# Patient Record
Sex: Male | Born: 1955 | Race: White | Hispanic: No | Marital: Married | Smoking: Former smoker
Health system: Southern US, Community
[De-identification: ages and names within clinical notes are randomized; demographics above are authoritative.]

## PROBLEM LIST (undated history)

## (undated) DIAGNOSIS — N200 Calculus of kidney: Secondary | ICD-10-CM

## (undated) DIAGNOSIS — R011 Cardiac murmur, unspecified: Secondary | ICD-10-CM

## (undated) DIAGNOSIS — E119 Type 2 diabetes mellitus without complications: Secondary | ICD-10-CM

## (undated) DIAGNOSIS — E7800 Pure hypercholesterolemia, unspecified: Secondary | ICD-10-CM

## (undated) DIAGNOSIS — M109 Gout, unspecified: Secondary | ICD-10-CM

## (undated) DIAGNOSIS — I2510 Atherosclerotic heart disease of native coronary artery without angina pectoris: Secondary | ICD-10-CM

## (undated) DIAGNOSIS — K219 Gastro-esophageal reflux disease without esophagitis: Secondary | ICD-10-CM

## (undated) DIAGNOSIS — I10 Essential (primary) hypertension: Secondary | ICD-10-CM

## (undated) DIAGNOSIS — K602 Anal fissure, unspecified: Secondary | ICD-10-CM

## (undated) DIAGNOSIS — R432 Parageusia: Secondary | ICD-10-CM

## (undated) DIAGNOSIS — K635 Polyp of colon: Secondary | ICD-10-CM

## (undated) HISTORY — DX: Anal fissure, unspecified: K60.2

## (undated) HISTORY — DX: Calculus of kidney: N20.0

## (undated) HISTORY — DX: Atherosclerotic heart disease of native coronary artery without angina pectoris: I25.10

## (undated) HISTORY — PX: NO PAST SURGERIES: SHX2092

## (undated) HISTORY — DX: Polyp of colon: K63.5

---

## 2002-12-03 ENCOUNTER — Other Ambulatory Visit: Admission: RE | Admit: 2002-12-03 | Discharge: 2002-12-03 | Payer: Self-pay | Admitting: Otolaryngology

## 2002-12-04 ENCOUNTER — Encounter: Payer: Self-pay | Admitting: Otolaryngology

## 2002-12-04 ENCOUNTER — Encounter: Admission: RE | Admit: 2002-12-04 | Discharge: 2002-12-04 | Payer: Self-pay | Admitting: Otolaryngology

## 2003-05-25 ENCOUNTER — Emergency Department (HOSPITAL_COMMUNITY): Admission: EM | Admit: 2003-05-25 | Discharge: 2003-05-26 | Payer: Self-pay | Admitting: Emergency Medicine

## 2012-01-24 ENCOUNTER — Observation Stay (HOSPITAL_COMMUNITY)
Admission: EM | Admit: 2012-01-24 | Discharge: 2012-01-25 | Disposition: A | Discharge status: Home / Self Care | Payer: BC Managed Care – PPO | Source: Ambulatory Visit | Attending: Internal Medicine | Admitting: Internal Medicine

## 2012-01-24 ENCOUNTER — Other Ambulatory Visit: Payer: Self-pay

## 2012-01-24 ENCOUNTER — Emergency Department (HOSPITAL_COMMUNITY): Payer: BC Managed Care – PPO

## 2012-01-24 ENCOUNTER — Encounter (HOSPITAL_COMMUNITY): Payer: Self-pay | Admitting: Emergency Medicine

## 2012-01-24 DIAGNOSIS — R011 Cardiac murmur, unspecified: Secondary | ICD-10-CM | POA: Insufficient documentation

## 2012-01-24 DIAGNOSIS — K219 Gastro-esophageal reflux disease without esophagitis: Secondary | ICD-10-CM | POA: Diagnosis present

## 2012-01-24 DIAGNOSIS — R432 Parageusia: Secondary | ICD-10-CM

## 2012-01-24 DIAGNOSIS — D170 Benign lipomatous neoplasm of skin and subcutaneous tissue of head, face and neck: Secondary | ICD-10-CM | POA: Diagnosis present

## 2012-01-24 DIAGNOSIS — E119 Type 2 diabetes mellitus without complications: Secondary | ICD-10-CM | POA: Insufficient documentation

## 2012-01-24 DIAGNOSIS — I10 Essential (primary) hypertension: Secondary | ICD-10-CM | POA: Diagnosis present

## 2012-01-24 DIAGNOSIS — D1779 Benign lipomatous neoplasm of other sites: Secondary | ICD-10-CM | POA: Insufficient documentation

## 2012-01-24 DIAGNOSIS — R079 Chest pain, unspecified: Principal | ICD-10-CM | POA: Diagnosis present

## 2012-01-24 DIAGNOSIS — J329 Chronic sinusitis, unspecified: Secondary | ICD-10-CM | POA: Insufficient documentation

## 2012-01-24 DIAGNOSIS — R0789 Other chest pain: Secondary | ICD-10-CM

## 2012-01-24 DIAGNOSIS — E785 Hyperlipidemia, unspecified: Secondary | ICD-10-CM | POA: Diagnosis present

## 2012-01-24 HISTORY — DX: Gout, unspecified: M10.9

## 2012-01-24 HISTORY — DX: Type 2 diabetes mellitus without complications: E11.9

## 2012-01-24 HISTORY — DX: Parageusia: R43.2

## 2012-01-24 HISTORY — DX: Essential (primary) hypertension: I10

## 2012-01-24 HISTORY — DX: Cardiac murmur, unspecified: R01.1

## 2012-01-24 HISTORY — DX: Gastro-esophageal reflux disease without esophagitis: K21.9

## 2012-01-24 HISTORY — DX: Pure hypercholesterolemia, unspecified: E78.00

## 2012-01-24 LAB — DIFFERENTIAL
Eosinophils Relative: 3 % (ref 0–5)
Lymphocytes Relative: 26 % (ref 12–46)
Lymphs Abs: 2.3 10*3/uL (ref 0.7–4.0)
Monocytes Absolute: 0.6 10*3/uL (ref 0.1–1.0)
Monocytes Relative: 7 % (ref 3–12)
Neutro Abs: 5.9 10*3/uL (ref 1.7–7.7)

## 2012-01-24 LAB — COMPREHENSIVE METABOLIC PANEL
Albumin: 4.5 g/dL (ref 3.5–5.2)
Alkaline Phosphatase: 42 U/L (ref 39–117)
BUN: 14 mg/dL (ref 6–23)
CO2: 21 mEq/L (ref 19–32)
Chloride: 99 mEq/L (ref 96–112)
Creatinine, Ser: 0.57 mg/dL (ref 0.50–1.35)
GFR calc non Af Amer: >90 mL/min (ref >90)
Glucose, Bld: 107 mg/dL — ABNORMAL HIGH (ref 70–99)
Potassium: 3.9 mEq/L (ref 3.5–5.1)
Total Bilirubin: 0.3 mg/dL (ref 0.3–1.2)

## 2012-01-24 LAB — BASIC METABOLIC PANEL
BUN: 16 mg/dL (ref 6–23)
CO2: 19 mEq/L (ref 19–32)
Calcium: 10 mg/dL (ref 8.4–10.5)
Chloride: 100 mEq/L (ref 96–112)
Creatinine, Ser: 0.6 mg/dL (ref 0.50–1.35)
GFR calc Af Amer: >90 mL/min (ref >90)
GFR calc non Af Amer: >90 mL/min (ref >90)
Glucose, Bld: 197 mg/dL — ABNORMAL HIGH (ref 70–99)
Potassium: 4.1 mEq/L (ref 3.5–5.1)
Sodium: 136 mEq/L (ref 135–145)

## 2012-01-24 LAB — CBC
HCT: 44 % (ref 39.0–52.0)
HCT: 44.7 % (ref 39.0–52.0)
Hemoglobin: 15.8 g/dL (ref 13.0–17.0)
Hemoglobin: 16 g/dL (ref 13.0–17.0)
MCH: 31.2 pg (ref 26.0–34.0)
MCHC: 35.9 g/dL (ref 30.0–36.0)
MCV: 87 fL (ref 78.0–100.0)
MCV: 86.3 fL (ref 78.0–100.0)
Platelets: 208 10*3/uL (ref 150–400)
RBC: 5.06 MIL/uL (ref 4.22–5.81)
RBC: 5.18 MIL/uL (ref 4.22–5.81)
RDW: 12.7 % (ref 11.5–15.5)
RDW: 12.6 % (ref 11.5–15.5)
WBC: 7 10*3/uL (ref 4.0–10.5)
WBC: 9.1 10*3/uL (ref 4.0–10.5)

## 2012-01-24 LAB — CARDIAC PANEL(CRET KIN+CKTOT+MB+TROPI)
CK, MB: 4.1 ng/mL — ABNORMAL HIGH (ref 0.3–4.0)
Relative Index: 2.5 (ref 0.0–2.5)

## 2012-01-24 LAB — TROPONIN I: Troponin I: <0.3 ng/mL (ref <0.30)

## 2012-01-24 LAB — D-DIMER, QUANTITATIVE: D-Dimer, Quant: <0.22 ug/mL-FEU (ref 0.00–0.48)

## 2012-01-24 LAB — MAGNESIUM: Magnesium: 1.9 mg/dL (ref 1.5–2.5)

## 2012-01-24 LAB — HEMOGLOBIN A1C: Hgb A1c MFr Bld: 7.6 % — ABNORMAL HIGH (ref <5.7)

## 2012-01-24 MED ORDER — SODIUM CHLORIDE 0.9 % IV SOLN: 250.0000 mL | INTRAVENOUS | Status: DC | PRN

## 2012-01-24 MED ORDER — SODIUM CHLORIDE 0.9 % IJ SOLN
3.0000 mL | Freq: Two times a day (BID) | INTRAMUSCULAR | Status: DC
  Administered 2012-01-24: 3 mL via INTRAVENOUS

## 2012-01-24 MED ORDER — SIMVASTATIN 20 MG PO TABS
20.0000 mg | ORAL_TABLET | Freq: Every day | ORAL | Status: DC
  Filled 2012-01-24: qty 1

## 2012-01-24 MED ORDER — ASPIRIN 81 MG PO CHEW: 324.0000 mg | CHEWABLE_TABLET | ORAL | Status: DC

## 2012-01-24 MED ORDER — PANTOPRAZOLE SODIUM 40 MG PO TBEC
40.0000 mg | DELAYED_RELEASE_TABLET | Freq: Every day | ORAL | Status: DC
  Administered 2012-01-24: 40 mg via ORAL
  Filled 2012-01-24: qty 1

## 2012-01-24 MED ORDER — ASPIRIN EC 81 MG PO TBEC
81.0000 mg | DELAYED_RELEASE_TABLET | Freq: Every day | ORAL | Status: DC
  Administered 2012-01-25: 81 mg via ORAL
  Filled 2012-01-24: qty 1

## 2012-01-24 MED ORDER — ACETAMINOPHEN 325 MG PO TABS: 650.0000 mg | ORAL_TABLET | ORAL | Status: DC | PRN

## 2012-01-24 MED ORDER — LISINOPRIL 20 MG PO TABS
20.0000 mg | ORAL_TABLET | Freq: Every day | ORAL | Status: DC
  Administered 2012-01-25: 20 mg via ORAL
  Filled 2012-01-24: qty 1

## 2012-01-24 MED ORDER — ASPIRIN 300 MG RE SUPP
300.0000 mg | RECTAL | Status: DC
  Filled 2012-01-24: qty 1

## 2012-01-24 MED ORDER — NITROGLYCERIN 0.4 MG SL SUBL
0.4000 mg | SUBLINGUAL_TABLET | SUBLINGUAL | Status: DC | PRN
Start: 2012-01-24 — End: 2012-01-25

## 2012-01-24 MED ORDER — ENOXAPARIN SODIUM 40 MG/0.4ML ~~LOC~~ SOLN
40.0000 mg | SUBCUTANEOUS | Status: DC
  Administered 2012-01-24: 40 mg via SUBCUTANEOUS
  Filled 2012-01-24 (×2): qty 0.4

## 2012-01-24 MED ORDER — ALPRAZOLAM 0.25 MG PO TABS: 0.2500 mg | ORAL_TABLET | Freq: Two times a day (BID) | ORAL | Status: DC | PRN

## 2012-01-24 MED ORDER — SODIUM CHLORIDE 0.9 % IJ SOLN: 3.0000 mL | INTRAMUSCULAR | Status: DC | PRN

## 2012-01-24 MED ORDER — ONDANSETRON HCL 4 MG/2ML IJ SOLN: 4.0000 mg | Freq: Four times a day (QID) | INTRAMUSCULAR | Status: DC | PRN

## 2012-01-24 NOTE — ED Notes (Signed)
Admitting MD at bedside.

## 2012-01-24 NOTE — H&P (Signed)
[facility] History and Physical  Tramane Gorum ZOX:[id number] DOB: Jun 01, 1956 DOA: 01/24/2012  Referring physician: EDP PCP: Ailene Ravel, MD   Chief Complaint: Chest Pain  HPI:  56 yo gentleman with hyperlipidemia, hypertension and congenital PFO who has a PCP in [place], [place] that he sees for primary care. He developed persistent intermittent chest pain this AM - pain not induced by activity, described as "palpitations" has sense of anxiety and that "something just isn't right in my chest"- very difficult for him to describe. No diaphoresis, or SOB. No radiation of pain, all substernal. No recent illness or cough. No new medications - he does say that he has had an unintentional 20 lbs weight loss in the past 6 months. Father has hx of heart attack. Does not smoke, but uses smokeless tobacco  Review of Systems:  Negative except for HPI.  Past Medical History  Diagnosis Date  . Hypertension   . Hypercholesteremia   . Diabetes mellitus    History reviewed. No pertinent past surgical history. Social History:  does not have a smoking history on file. His smokeless tobacco use includes Snuff. He reports that he drinks alcohol. He reports that he does not use illicit drugs.  Allergies  Allergen Reactions  . Sulfa Antibiotics Rash    No family history on file.  Prior to Admission medications   Medication Sig Start Date End Date Taking? Authorizing Provider  aspirin EC 81 MG tablet Take 81 mg by mouth daily.   Yes Historical Provider, MD  lisinopril (PRINIVIL,ZESTRIL) 20 MG tablet Take 20 mg by mouth daily.   Yes Historical Provider, MD  pravastatin (PRAVACHOL) 40 MG tablet Take 40 mg by mouth daily.   Yes Historical Provider, MD  ranitidine (ZANTAC) 150 MG capsule Take 150 mg by mouth daily.   Yes Historical Provider, MD   Physical Exam: Filed Vitals:   01/24/12 1747 01/24/12 1800 01/24/12 1830 01/24/12 1845  BP: 116/99 126/75 126/75 127/74  Pulse: 65 65 68 66  Temp: 98.6  F (37 C)     TempSrc: Oral     Resp: 16 17 18 14   SpO2: 96% 95% 94% 95%    General appearance: NAD, conversant  Eyes: anicteric sclerae, moist conjunctivae; no lid-lag; PERRLA HENT: Atraumatic; oropharynx clear with moist mucous membranes and no mucosal ulcerations; normal hard and soft palate Neck: Trachea midline; FROM, supple, no thyromegaly or lymphadenopathy Lungs: CTA, with normal respiratory effort and no intercostal retractions CV: RRR, no MRGs  Abdomen: Soft, non-tender; no masses or HSM Extremities: No peripheral edema or extremity lymphadenopathy Skin: Normal temperature, turgor and texture; no rash, ulcers or subcutaneous nodules Psych: Appropriate affect, alert and oriented to person, place and time  Labs on Admission:  Basic Metabolic Panel:  Lab 01/24/12 8119 01/24/12 1427  NA 137 136  K 3.9 4.1  CL 99 100  CO2 21 19  GLUCOSE 107* 197*  BUN 14 16  CREATININE 0.57 0.60  CALCIUM 9.9 10.0  MG 1.9 --  PHOS -- --   Liver Function Tests:  Lab 01/24/12 1828  AST 26  ALT 40  ALKPHOS 42  BILITOT 0.3  PROT 7.2  ALBUMIN 4.5   CBC:  Lab 01/24/12 1828 01/24/12 1427  WBC 9.1 7.0  NEUTROABS 5.9 --  HGB 16.0 15.8  HCT 44.7 44.0  MCV 86.3 87.0  PLT 219 208   Cardiac Enzymes:  Lab 01/24/12 1828 01/24/12 1423  CKTOTAL 164 --  CKMB 4.1* --  CKMBINDEX -- --  TROPONINI <0.30 <0.30    Radiological Exams on Admission: Dg Chest Portable 1 View  01/24/2012  *RADIOLOGY REPORT*  Clinical Data: Left-sided chest pain.  PORTABLE CHEST - 1 VIEW  Comparison: None.  Findings: The heart size is normal.  The lungs are clear.  The visualized soft tissues and bony thorax are unremarkable.  IMPRESSION: Negative chest.  Original Report Authenticated By: Jamesetta Orleans. MATTERN, M.D.    EKG: Independently reviewed. Normal, no ischemia  Assessment/Plan Active Problems:  Chest pain   1. Chest Pain, Atypical  Atypical features. Described as palpitations or "pressure".  Feels like his heart is racing with deep discomfort, difficult to localized. Will admit for observation. Vitals are stable, his EKG is normal, ?some V6 changes. He had a CT in 2004 of his abdomen that showed aortic atherosclerosis, so he probably has CAD as well. -monitor on tele -CEX3 -12 Lead EKG in AM - PPI -D Dimer - if r/o then d/c in AM with outpatient cardiology evaluation  Code Status: Full Code Family Communication: Patient only. Disposition Plan: Home tomorrow pending r/o  Anderson Malta, MD  [facility] Pager [phone]  If 7PM-7AM, please contact night-coverage [email or web address] Password [email or web address] 01/24/2012, 7:51 PM

## 2012-01-24 NOTE — ED Provider Notes (Signed)
History     CSN: [id number]  Arrival date & time 01/24/12  1334   First MD Initiated Contact with Patient 01/24/12 1355      Chief Complaint  Patient presents with  . Chest Pain    (Consider location/radiation/quality/duration/timing/severity/associated sxs/prior treatment) Patient is a 56 y.o. male presenting with chest pain. The history is provided by the patient and the spouse. No language interpreter was used.  Chest Pain The chest pain began 5 - 7 days ago. Episode Length: 10-15 minutes. Chest pain occurs frequently. The chest pain is worsening. At its most intense, the pain is at 2/10. The pain is currently at 2/10. The severity of the pain is mild. The quality of the pain is described as aching (left sided). The pain radiates to the left arm (associated numbness). Primary symptoms include shortness of breath and palpitations. Pertinent negatives for primary symptoms include no fever and no nausea.  The palpitations also occurred with shortness of breath.   Associated symptoms include diaphoresis and numbness. Associated symptoms comments: Numbness in the left arm. He tried nothing for the symptoms. Risk factors include male gender, obesity and smoking/tobacco exposure (DM II, hyperlipidemia, HTN with adherence to mediciation).  His past medical history is significant for congenital heart disease, diabetes and hypertension. 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  His family medical history is significant for CAD in family and heart disease in family. Family history comments: Father with MI late 61's  Procedure history is positive for cardiac catheterization. Procedure history comments: Completed in [place] 8 years ago for evaluation of congenital defect. Normal per patient.     Past Medical History  Diagnosis Date  . Hypertension   . Hypercholesteremia   . Diabetes mellitus     History reviewed. No  pertinent past surgical history.  No family history on file.  History  Substance Use Topics  . Smoking status: Not on file  . Smokeless tobacco: Current User    Types: Snuff  . Alcohol Use: Yes      Review of Systems  Constitutional: Positive for diaphoresis. Negative for fever and chills.  Respiratory: Positive for shortness of breath.   Cardiovascular: Positive for chest pain and palpitations. Negative for leg swelling.       Patient has taken his pulse during periods where he feels his heart "flutter". He states that his heart feel like it skips beats.  Chest pressure not reproducible on exam.  Gastrointestinal: Negative for nausea.  Neurological: Positive for numbness.       Numbness of left arm    Allergies  Review of patient's allergies indicates not on file.  Home Medications  No current outpatient prescriptions on file.  BP 135/81  Pulse 80  Temp 98.8 F (37.1 C) (Oral)  Resp 17  SpO2 97%  Physical Exam  Constitutional: He appears well-developed and well-nourished.  HENT:  Head: Normocephalic and atraumatic.  Cardiovascular: Normal rate, regular rhythm and normal heart sounds.   Pulmonary/Chest: Effort normal and breath sounds normal.    ED Course  Procedures (including critical care time)  Labs Reviewed  BASIC METABOLIC PANEL - Abnormal; Notable for the following:    Glucose, Bld 197 (*)     All other components within normal limits  TROPONIN I  CBC   Dg Chest Portable 1 View  01/24/2012  *RADIOLOGY REPORT*  Clinical Data: Left-sided chest pain.  PORTABLE CHEST - 1  VIEW  Comparison: None.  Findings: The heart size is normal.  The lungs are clear.  The visualized soft tissues and bony thorax are unremarkable.  IMPRESSION: Negative chest.  Original Report Authenticated By: Jamesetta Orleans. MATTERN, M.D.     1. Chest discomfort       MDM  56 y/o male with multiple risk factors for a cardiac event to include  DM II, HTN, hyperlipidemia. Patient  present with left-sided chest pressure and numbness in the left arm. Associated diaphoresis and SOB.  EKG negative. Troponin negative. BMP significant for elevated glucose. Heart score:4. TIMI score: 3. [facility] called for admission to medicine.      Pixie Casino, PA-C 01/24/12 1558

## 2012-01-24 NOTE — ED Notes (Signed)
No neuro deficits noted at this time.  

## 2012-01-24 NOTE — ED Provider Notes (Signed)
Medical screening examination/treatment/procedure(s) were performed by non-physician practitioner and as supervising physician I was immediately available for consultation/collaboration.    Nelia Shi, MD 01/24/12 [phone]

## 2012-01-24 NOTE — ED Notes (Signed)
Awaiting Admitting Physician

## 2012-01-24 NOTE — ED Notes (Signed)
Pt. Stated, I've had some heart fluttering , odd taste in my mouth for about 2 months and constantly dry. I feel some achiness in my chest and my lt. Arm feels a little numb.

## 2012-01-25 DIAGNOSIS — I10 Essential (primary) hypertension: Secondary | ICD-10-CM | POA: Diagnosis present

## 2012-01-25 DIAGNOSIS — D170 Benign lipomatous neoplasm of skin and subcutaneous tissue of head, face and neck: Secondary | ICD-10-CM | POA: Diagnosis present

## 2012-01-25 DIAGNOSIS — E785 Hyperlipidemia, unspecified: Secondary | ICD-10-CM | POA: Diagnosis present

## 2012-01-25 DIAGNOSIS — K219 Gastro-esophageal reflux disease without esophagitis: Secondary | ICD-10-CM | POA: Diagnosis present

## 2012-01-25 DIAGNOSIS — R072 Precordial pain: Secondary | ICD-10-CM

## 2012-01-25 LAB — CARDIAC PANEL(CRET KIN+CKTOT+MB+TROPI)
CK, MB: 3.8 ng/mL (ref 0.3–4.0)
Relative Index: 2.6 — ABNORMAL HIGH (ref 0.0–2.5)
Total CK: 149 U/L (ref 7–232)
Total CK: 152 U/L (ref 7–232)

## 2012-01-25 LAB — LIPID PANEL
HDL: 29 mg/dL — ABNORMAL LOW (ref >39)
LDL Cholesterol: 78 mg/dL (ref 0–99)
Triglycerides: 278 mg/dL — ABNORMAL HIGH (ref <150)
VLDL: 56 mg/dL — ABNORMAL HIGH (ref 0–40)

## 2012-01-25 MED ORDER — CARVEDILOL 3.125 MG PO TABS
3.1250 mg | ORAL_TABLET | Freq: Two times a day (BID) | ORAL | Status: DC
  Filled 2012-01-25 (×2): qty 1

## 2012-01-25 MED ORDER — METFORMIN HCL 500 MG PO TABS: 500.0000 mg | ORAL_TABLET | Freq: Two times a day (BID) | ORAL | Status: DC

## 2012-01-25 MED ORDER — FLUTICASONE PROPIONATE 50 MCG/ACT NA SUSP: 2.0000 | Freq: Every day | NASAL | Status: DC

## 2012-01-25 MED ORDER — CHLORHEXIDINE GLUCONATE 0.12 % MT SOLN
15.0000 mL | Freq: Two times a day (BID) | OROMUCOSAL | Status: DC
  Administered 2012-01-25: 15 mL via OROMUCOSAL
  Filled 2012-01-25 (×2): qty 15

## 2012-01-25 MED ORDER — CHLORHEXIDINE GLUCONATE 0.12 % MT SOLN: 15.0000 mL | Freq: Two times a day (BID) | OROMUCOSAL | Status: AC

## 2012-01-25 MED ORDER — PANTOPRAZOLE SODIUM 40 MG PO TBEC: 40.0000 mg | DELAYED_RELEASE_TABLET | Freq: Every day | ORAL | Status: DC

## 2012-01-25 MED ORDER — MUPIROCIN CALCIUM 2 % NA OINT: TOPICAL_OINTMENT | Freq: Two times a day (BID) | NASAL | Status: DC

## 2012-01-25 MED ORDER — CARVEDILOL 3.125 MG PO TABS: 3.1250 mg | ORAL_TABLET | Freq: Two times a day (BID) | ORAL | Status: DC

## 2012-01-25 MED ORDER — FLUTICASONE PROPIONATE 50 MCG/ACT NA SUSP
2.0000 | Freq: Every day | NASAL | Status: DC
  Administered 2012-01-25: 2 via NASAL
  Filled 2012-01-25: qty 16

## 2012-01-25 NOTE — Discharge Summary (Signed)
Physician Discharge Summary  Gregory Fox WUJ:[id number] DOB: 12-12-1955 DOA: 01/24/2012  PCP: Ailene Ravel, MD  Admit date: 01/24/2012 Discharge date: 01/25/2012  Recommendations for Outpatient Follow-up:  Follow-up with [facility] for outpatient stress test and cardiology evaluation, follow up 2D echo results Follow-up with Primary Doctor in 1-2 weeks.  Discharge Diagnoses:  1. Chest Pain, ruled out for ACS 2. GERD 3. Chronic Sinusitis 4. Hyperlipidemia 5. Hypertension 6. Congential Heart Murmur/PFO 7. Neck Lipoma   Discharge Condition: stable  Diet recommendation: Heart Healthy, Low Fat, Low Sodium   History of present illness:   56 yo gentleman with hyperlipidemia, hypertension, GERD and congenital heart murmur who was admitted ion 7/4 with acute onset chest pain. He developed persistent intermittent chest pain the morning of presentation - pain not induced by activity, described as "palpitations" has sense of anxiety and that "something just isn't right in my chest"- very difficult for him to describe. No diaphoresis, or SOB. No radiation of pain, all substernal. No recent illness or cough. No new medications - he does say that he has had an unintentional 20 lbs weight loss in the past 6 months because of a metallic taste in his mouth and reduced appetite correlated with recent dental work. Father has hx of heart attack. Does not smoke, but uses smokeless tobacco regularly.   Hospital Course:  Admitted from ED for Chest pain evaluation. Telemetry was normal. Cardiac enzymes were negative and EKG did not show any ischemic changes. No distinct chest pain while in hospital, but unchanged non-specific discomfort, had been having increased GERD symptoms over past few days. Had evidence of aortic atheroscleroisis on 2004 CT Abdomen. *R/O for ACS, scheduled for outpatient cardiac stress Myoview, and cardiology evaluation *Started on PPI *resumed his BP medications and added a  low dose beta-blocker *ASA daily *continue statin dose *2D echo pending at time of discharge  Procedures:  none  Consultations:  Outpatient [facility]  Discharge Exam: Filed Vitals:   01/25/12 0559  BP: 116/72  Pulse: 69  Temp: 97.7 F (36.5 C)  Resp: 19   Filed Vitals:   01/24/12 1930 01/24/12 1945 01/24/12 2020 01/25/12 0559  BP: 132/66 132/72 131/74 116/72  Pulse: 68 67 72 69  Temp:   98.4 F (36.9 C) 97.7 F (36.5 C)  TempSrc:    Oral  Resp: 16 22  19   Height:   6' (1.829 m)   Weight:   97.9 kg (215 lb 13.3 oz)   SpO2: 95% 97% 97% 96%   General appearance: NAD, conversant  Eyes: anicteric sclerae, moist conjunctivae; no lid-lag; PERRLA HENT: Atraumatic; oropharynx clear with moist mucous membranes and no mucosal ulcerations; normal hard and soft palate Neck: Trachea midline; FROM, supple, no thyromegaly or lymphadenopathy Lungs: CTA, with normal respiratory effort and no intercostal retractions CV: RRR, no MRGs  Abdomen: Soft, non-tender; no masses or HSM Extremities: No peripheral edema or extremity lymphadenopathy Skin: Normal temperature, turgor and texture; no rash, ulcers or subcutaneous nodules Psych: Appropriate affect, alert and oriented to person, place and time  Discharge Instructions  Discharge Orders    Future Appointments: Provider: Department: Dept Phone: Center:   01/31/2012 11:45 AM [facility] ([facility]  None     Medication List  As of 01/25/2012 10:27 AM   STOP taking these medications         metFORMIN 500 MG tablet      ranitidine 150 MG capsule  TAKE these medications         aspirin EC 81 MG tablet   Take 81 mg by mouth daily.      chlorhexidine 0.12 % solution   Commonly known as: PERIDEX   Use as directed 15 mLs in the mouth or throat 2 (two) times daily.      fluticasone 50 MCG/ACT nasal spray   Commonly known as: FLONASE   Place 2 sprays into the nose daily.      lisinopril 20 MG  tablet   Commonly known as: PRINIVIL,ZESTRIL   Take 20 mg by mouth daily.      pantoprazole 40 MG tablet   Commonly known as: PROTONIX   Take 1 tablet (40 mg total) by mouth daily.      pravastatin 40 MG tablet   Commonly known as: PRAVACHOL   Take 40 mg by mouth daily.           Follow-up Information    Follow up with [facility]   Contact information:   [address] [place] [address]       Follow up with [facility], MD. Schedule an appointment as soon as possible for a visit in 2 weeks. (If symptoms worsen)    Contact information:   Dr. Sharman Crate [name] [address] [place] [address] [phone]           The results of significant diagnostics from this hospitalization (including imaging, microbiology, ancillary and laboratory) are listed below for reference.    Significant Diagnostic Studies: Dg Chest Portable 1 View  01/24/2012  *RADIOLOGY REPORT*  Clinical Data: Left-sided chest pain.  PORTABLE CHEST - 1 VIEW  Comparison: None.  Findings: The heart size is normal.  The lungs are clear.  The visualized soft tissues and bony thorax are unremarkable.  IMPRESSION: Negative chest.  Original Report Authenticated By: Jamesetta Orleans. MATTERN, M.D.    Microbiology: No results found for this or any previous visit (from the past 240 hour(s)).   Labs: Basic Metabolic Panel:  Lab 01/24/12 5366 01/24/12 1427  NA 137 136  K 3.9 4.1  CL 99 100  CO2 21 19  GLUCOSE 107* 197*  BUN 14 16  CREATININE 0.57 0.60  CALCIUM 9.9 10.0  MG 1.9 --  PHOS -- --   Liver Function Tests:  Lab 01/24/12 1828  AST 26  ALT 40  ALKPHOS 42  BILITOT 0.3  PROT 7.2  ALBUMIN 4.5   No results found for this basename: LIPASE:5,AMYLASE:5 in the last 168 hours No results found for this basename: AMMONIA:5 in the last 168 hours CBC:  Lab 01/24/12 1828 01/24/12 1427  WBC 9.1 7.0  NEUTROABS 5.9 --  HGB 16.0 15.8  HCT  44.7 44.0  MCV 86.3 87.0  PLT 219 208   Cardiac Enzymes:  Lab 01/25/12 0620 01/25/12 0024 01/24/12 1828 01/24/12 1423  CKTOTAL 152 149 164 --  CKMB 3.9 3.8 4.1* --  CKMBINDEX -- -- -- --  TROPONINI <0.30 <0.30 <0.30 <0.30   Time coordinating discharge: 35 minutes  Signed:  Anderson Malta, MD  [facility] 01/25/2012, 10:27 AM

## 2012-01-25 NOTE — Progress Notes (Signed)
  Echocardiogram 2D Echocardiogram has been performed.  Gregory Fox FRANCES 01/25/2012, 12:24 PM

## 2012-01-25 NOTE — Progress Notes (Signed)
Pt/family given discharge instructions, medication lists, follow up appointments, and when to call the doctor.  Pt/family verbalizes understanding. Gregory Fox    

## 2012-01-25 NOTE — Progress Notes (Signed)
Utilization review complete 

## 2012-01-31 ENCOUNTER — Ambulatory Visit (HOSPITAL_COMMUNITY): Payer: BC Managed Care – PPO | Attending: Internal Medicine | Admitting: Radiology

## 2012-01-31 VITALS — BP 114/64 | Ht 72.0 in | Wt 214.0 lb

## 2012-01-31 DIAGNOSIS — F17200 Nicotine dependence, unspecified, uncomplicated: Secondary | ICD-10-CM | POA: Insufficient documentation

## 2012-01-31 DIAGNOSIS — R0602 Shortness of breath: Secondary | ICD-10-CM

## 2012-01-31 DIAGNOSIS — Z8249 Family history of ischemic heart disease and other diseases of the circulatory system: Secondary | ICD-10-CM | POA: Insufficient documentation

## 2012-01-31 DIAGNOSIS — I10 Essential (primary) hypertension: Secondary | ICD-10-CM | POA: Insufficient documentation

## 2012-01-31 DIAGNOSIS — R0609 Other forms of dyspnea: Secondary | ICD-10-CM | POA: Insufficient documentation

## 2012-01-31 DIAGNOSIS — R0989 Other specified symptoms and signs involving the circulatory and respiratory systems: Secondary | ICD-10-CM | POA: Insufficient documentation

## 2012-01-31 DIAGNOSIS — R079 Chest pain, unspecified: Secondary | ICD-10-CM | POA: Insufficient documentation

## 2012-01-31 DIAGNOSIS — E119 Type 2 diabetes mellitus without complications: Secondary | ICD-10-CM | POA: Insufficient documentation

## 2012-01-31 DIAGNOSIS — E785 Hyperlipidemia, unspecified: Secondary | ICD-10-CM | POA: Insufficient documentation

## 2012-01-31 MED ORDER — TECHNETIUM TC 99M TETROFOSMIN IV KIT
30.0000 | PACK | Freq: Once | INTRAVENOUS | Status: AC | PRN
  Administered 2012-01-31: 30 via INTRAVENOUS

## 2012-01-31 MED ORDER — TECHNETIUM TC 99M TETROFOSMIN IV KIT
10.0000 | PACK | Freq: Once | INTRAVENOUS | Status: AC | PRN
  Administered 2012-01-31: 10 via INTRAVENOUS

## 2012-01-31 NOTE — Progress Notes (Signed)
[facility] SITE 3 NUCLEAR MED [address] [place] [address] [phone]  Cardiology Nuclear Med Study  Gregory Fox is a 56 y.o. male     MRN : [id number]     DOB: Mar 02, 1956  Procedure Date: 01/31/2012  Nuclear Med Background Indication for Stress Test:  Evaluation for Ischemia and 01/24/12 Post Hospital: Chest pain, (-) ezymes History:  01/25/12 ECHO: 55-60% Cardiac Risk Factors: Family History - CAD, Hypertension, Lipids, NIDDM and Smokeless Tobacco  Symptoms:  Chest Pain and DOE   Nuclear Pre-Procedure Caffeine/Decaff Intake:  None NPO After: 6:00am   Lungs:  clear O2 Sat: 98% on room air. IV 0.9% NS with Angio Cath:  18g  IV Site: R Antecubital  IV Started by:  Stanton Kidney, EMT-P  Chest Size (in):  46 Cup Size: n/a  Height: 6' (1.829 m)  Weight:  214 lb (97.07 kg)  BMI:  Body mass index is 29.02 kg/(m^2). Tech Comments:  Coreg held > 19 hours, per patient.    Nuclear Med Study 1 or 2 day study: 1 day  Stress Test Type:  Stress  Reading MD: Dietrich Pates, MD  Order Authorizing Provider:  P.Nishan  Resting Radionuclide: Technetium 75m Tetrofosmin  Resting Radionuclide Dose: 11.0 mCi   Stress Radionuclide:  Technetium 60m Tetrofosmin  Stress Radionuclide Dose: 32.9 mCi           Stress Protocol Rest HR: 67 Stress HR: 146  Rest BP: 114/64 Stress BP: 195  Exercise Time (min): 7:15 METS: 8.90   Predicted Max HR: 162 bpm % Max HR: 90.12 bpm Rate Pressure Product: 21308   Dose of Adenosine (mg):  n/a Dose of Lexiscan: n/a mg  Dose of Atropine (mg): n/a Dose of Dobutamine: n/a mcg/kg/min (at max HR)  Stress Test Technologist: Milana Na, EMT-P  Nuclear Technologist:  Domenic Polite, CNMT     Rest Procedure:  Myocardial perfusion imaging was performed at rest 45 minutes following the intravenous administration of Technetium 35m Tetrofosmin. Rest ECG: NSR - Normal EKG  Stress Procedure:  The patient performed treadmill exercise using a Bruce   Protocol for 7:15 minutes. The patient stopped due to fatigue and denied any chest pain.  There were no significant ST-T wave changes.  Technetium 25m Tetrofosmin was injected at peak exercise and myocardial perfusion imaging was performed after a brief delay. Stress ECG: No significant change from baseline ECG  QPS Raw Data Images:  Soft tissue (diaphragm) underlies the inferior wall. Stress Images:  Thinning with decreased counts in the inferior wall (base, mid, distal)  Otherwise normal perfusion. Rest Images:  Comparison with the stress images reveals no significant change. Subtraction (SDS):  No evidence of ischemia. Transient Ischemic Dilatation (Normal <1.22):  0.89 Lung/Heart Ratio (Normal <0.45):  0.34  Quantitative Gated Spect Images QGS EDV:  98 ml QGS ESV:  39 ml  Impression Exercise Capacity:  Fair exercise capacity. BP Response:  Normal blood pressure response. Clinical Symptoms:  No chest pain. ECG Impression:  No significant ST segment change suggestive of ischemia. Comparison with Prior Nuclear Study: No previous exam.  Overall Impression:  Probable normal perfusion and soft tissue attenuation (diaphragm).  Overall low risk scan.  LV Ejection Fraction: 61%.  LV Wall Motion:  NL LV Function; NL Wall Motion  Dietrich Pates

## 2012-02-19 ENCOUNTER — Encounter: Payer: Self-pay | Admitting: Cardiovascular Disease

## 2012-02-19 ENCOUNTER — Encounter: Payer: Self-pay | Admitting: *Deleted

## 2012-02-21 ENCOUNTER — Ambulatory Visit (INDEPENDENT_AMBULATORY_CARE_PROVIDER_SITE_OTHER): Payer: BC Managed Care – PPO | Admitting: Cardiovascular Disease

## 2012-02-21 ENCOUNTER — Encounter: Payer: Self-pay | Admitting: Cardiovascular Disease

## 2012-02-21 VITALS — BP 136/85 | HR 70 | Wt 220.0 lb

## 2012-02-21 DIAGNOSIS — R079 Chest pain, unspecified: Secondary | ICD-10-CM

## 2012-02-21 DIAGNOSIS — I459 Conduction disorder, unspecified: Secondary | ICD-10-CM | POA: Insufficient documentation

## 2012-02-21 DIAGNOSIS — E785 Hyperlipidemia, unspecified: Secondary | ICD-10-CM

## 2012-02-21 DIAGNOSIS — I10 Essential (primary) hypertension: Secondary | ICD-10-CM

## 2012-02-21 DIAGNOSIS — I499 Cardiac arrhythmia, unspecified: Secondary | ICD-10-CM

## 2012-02-21 NOTE — Assessment & Plan Note (Signed)
Benign palpitations.  In ER had sensation with normal telemetry.  Outpatient event monitor for recurrence.  Continue coreg

## 2012-02-21 NOTE — Assessment & Plan Note (Signed)
Well controlled.  Continue current medications and low sodium Dash type diet.    

## 2012-02-21 NOTE — Assessment & Plan Note (Signed)
Normal myovue, ECG and R/O observe  ASA

## 2012-02-21 NOTE — Assessment & Plan Note (Signed)
Cholesterol is at goal.  Continue current dose of statin and diet Rx.  No myalgias or side effects.  F/U  LFT's in 6 months. Lab Results  Component Value Date   LDLCALC 78 01/25/2012

## 2012-02-21 NOTE — Progress Notes (Signed)
Patient ID: LADEN FIELDHOUSE, male   DOB: [birth year], 56 y.o.   MRN: [id number] 56 yo gentleman with hyperlipidemia, hypertension, GERD and congenital heart murmur who was admitted ion 7/4 with acute onset chest pain. He developed persistent intermittent chest pain the morning of presentation - pain not induced by activity, described as "palpitations" has sense of anxiety and that "something just isn't right in my chest"- very difficult for him to describe. No diaphoresis, or SOB. No radiation of pain, all substernal. No recent illness or cough. No new medications - he does say that he has had an unintentional 20 lbs weight loss in the past 6 months because of a metallic taste in his mouth and reduced appetite correlated with recent dental work. Father has hx of heart attack. Does not smoke, but uses smokeless tobacco regularly.  R/O and D/C home.  Outpatient myovue read by Dr Tenny Craw was essentially normal  01/31/12  Overall Impression: Probable normal perfusion and soft tissue attenuation (diaphragm). Overall low risk scan.  LV Ejection Fraction: 61%. LV Wall Motion: NL LV Function; NL Wall Motion  Echo done 01/25/12 reviewed and normal with no valve disease and EF 60%  Since D/C no palpitations or SSCP  ROS: Denies fever, malais, weight loss, blurry vision, decreased visual acuity, cough, sputum, SOB, hemoptysis, pleuritic pain, palpitaitons, heartburn, abdominal pain, melena, lower extremity edema, claudication, or rash.  All other systems reviewed and negative   General: Affect appropriate Healthy:  appears stated age HEENT: normal Neck supple with no adenopathy JVP normal no bruits no thyromegaly Lungs clear with no wheezing and good diaphragmatic motion Heart:  S1/S2 no murmur,rub, gallop or click PMI normal Abdomen: benighn, BS positve, no tenderness, no AAA no bruit.  No HSM or HJR Distal pulses intact with no bruits No edema Neuro non-focal Skin warm and dry No muscular  weakness  Medications Current Outpatient Prescriptions  Medication Sig Dispense Refill  . aspirin EC 81 MG tablet Take 81 mg by mouth daily.      . carvedilol (COREG) 3.125 MG tablet Take 1 tablet (3.125 mg total) by mouth 2 (two) times daily with a meal.  60 tablet  0  . fluticasone (FLONASE) 50 MCG/ACT nasal spray Place 2 sprays into the nose daily.  16 g  3  . lisinopril (PRINIVIL,ZESTRIL) 20 MG tablet Take 20 mg by mouth daily.      . metFORMIN (GLUCOPHAGE) 500 MG tablet Take 1 tablet (500 mg total) by mouth 2 (two) times daily with a meal.  60 tablet  3  . pantoprazole (PROTONIX) 40 MG tablet Take 1 tablet (40 mg total) by mouth daily.  30 tablet  3  . pravastatin (PRAVACHOL) 40 MG tablet Take 40 mg by mouth daily.        Allergies Sulfa antibiotics  Family History: No family history on file.  Social History: History   Social History  . Marital Status: Married    Spouse Name: N/A    Number of Children: N/A  . Years of Education: N/A   Occupational History  . Not on file.   Social History Main Topics  . Smoking status: Never Smoker   . Smokeless tobacco: Current User    Types: Snuff  . Alcohol Use: 1.8 oz/week    3 Cans of beer per week  . Drug Use: No  . Sexually Active: Yes   Other Topics Concern  . Not on file   Social History Narrative  . No narrative on  file    Electrocardiogram:  NSR rate 65  Normal ECG  Assessment and Plan

## 2012-02-21 NOTE — Patient Instructions (Signed)
Your physician recommends that you schedule a follow-up appointment in: YEAR WITH DR NISHAN  Your physician recommends that you continue on your current medications as directed. Please refer to the Current Medication list given to you today. 

## 2012-07-09 ENCOUNTER — Other Ambulatory Visit: Payer: Self-pay | Admitting: Otolaryngology

## 2012-07-09 DIAGNOSIS — J310 Chronic rhinitis: Secondary | ICD-10-CM

## 2012-07-14 ENCOUNTER — Ambulatory Visit
Admission: RE | Admit: 2012-07-14 | Discharge: 2012-07-14 | Disposition: A | Discharge status: Home / Self Care | Payer: BC Managed Care – PPO | Source: Ambulatory Visit | Attending: Otolaryngology | Admitting: Otolaryngology

## 2012-07-14 DIAGNOSIS — J310 Chronic rhinitis: Secondary | ICD-10-CM

## 2015-02-09 ENCOUNTER — Encounter (HOSPITAL_COMMUNITY): Payer: Self-pay | Admitting: Emergency Medicine

## 2015-02-09 ENCOUNTER — Emergency Department (HOSPITAL_COMMUNITY): Payer: BC Managed Care – PPO

## 2015-02-09 ENCOUNTER — Observation Stay (HOSPITAL_COMMUNITY)
Admission: EM | Admit: 2015-02-09 | Discharge: 2015-02-12 | Disposition: A | Discharge status: Home / Self Care | Payer: BC Managed Care – PPO | Attending: Internal Medicine | Admitting: Internal Medicine

## 2015-02-09 DIAGNOSIS — Z7982 Long term (current) use of aspirin: Secondary | ICD-10-CM | POA: Insufficient documentation

## 2015-02-09 DIAGNOSIS — I10 Essential (primary) hypertension: Secondary | ICD-10-CM | POA: Diagnosis present

## 2015-02-09 DIAGNOSIS — R0789 Other chest pain: Secondary | ICD-10-CM | POA: Diagnosis not present

## 2015-02-09 DIAGNOSIS — K219 Gastro-esophageal reflux disease without esophagitis: Secondary | ICD-10-CM | POA: Diagnosis present

## 2015-02-09 DIAGNOSIS — R0602 Shortness of breath: Secondary | ICD-10-CM | POA: Insufficient documentation

## 2015-02-09 DIAGNOSIS — Z79899 Other long term (current) drug therapy: Secondary | ICD-10-CM | POA: Insufficient documentation

## 2015-02-09 DIAGNOSIS — E118 Type 2 diabetes mellitus with unspecified complications: Secondary | ICD-10-CM | POA: Diagnosis not present

## 2015-02-09 DIAGNOSIS — R509 Fever, unspecified: Secondary | ICD-10-CM

## 2015-02-09 DIAGNOSIS — Z720 Tobacco use: Secondary | ICD-10-CM | POA: Diagnosis present

## 2015-02-09 DIAGNOSIS — E7800 Pure hypercholesterolemia, unspecified: Secondary | ICD-10-CM | POA: Insufficient documentation

## 2015-02-09 DIAGNOSIS — R079 Chest pain, unspecified: Principal | ICD-10-CM | POA: Diagnosis present

## 2015-02-09 DIAGNOSIS — E119 Type 2 diabetes mellitus without complications: Secondary | ICD-10-CM

## 2015-02-09 DIAGNOSIS — E785 Hyperlipidemia, unspecified: Secondary | ICD-10-CM | POA: Diagnosis not present

## 2015-02-09 DIAGNOSIS — R011 Cardiac murmur, unspecified: Secondary | ICD-10-CM | POA: Insufficient documentation

## 2015-02-09 DIAGNOSIS — M109 Gout, unspecified: Secondary | ICD-10-CM | POA: Insufficient documentation

## 2015-02-09 LAB — I-STAT TROPONIN, ED: Troponin i, poc: 0 ng/mL (ref 0.00–0.08)

## 2015-02-09 LAB — CBC
HEMATOCRIT: 44.5 % (ref 39.0–52.0)
Hemoglobin: 15.7 g/dL (ref 13.0–17.0)
MCH: 31 pg (ref 26.0–34.0)
MCHC: 35.3 g/dL (ref 30.0–36.0)
MCV: 87.8 fL (ref 78.0–100.0)
PLATELETS: 208 10*3/uL (ref 150–400)
RBC: 5.07 MIL/uL (ref 4.22–5.81)
RDW: 12.8 % (ref 11.5–15.5)
WBC: 9.6 10*3/uL (ref 4.0–10.5)

## 2015-02-09 LAB — BASIC METABOLIC PANEL
Anion gap: 10 (ref 5–15)
BUN: 11 mg/dL (ref 6–20)
CALCIUM: 8.9 mg/dL (ref 8.9–10.3)
CO2: 26 mmol/L (ref 22–32)
CREATININE: 0.83 mg/dL (ref 0.61–1.24)
Chloride: 100 mmol/L — ABNORMAL LOW (ref 101–111)
GFR calc Af Amer: >60 mL/min (ref >60)
GLUCOSE: 189 mg/dL — AB (ref 65–99)
POTASSIUM: 3.6 mmol/L (ref 3.5–5.1)
Sodium: 136 mmol/L (ref 135–145)

## 2015-02-09 LAB — D-DIMER, QUANTITATIVE (NOT AT ARMC)

## 2015-02-09 MED ORDER — HYDROMORPHONE HCL 1 MG/ML IJ SOLN
1.0000 mg | Freq: Once | INTRAMUSCULAR | Status: AC
  Administered 2015-02-09: 1 mg via INTRAVENOUS
  Filled 2015-02-09: qty 1

## 2015-02-09 MED ORDER — NITROGLYCERIN 0.4 MG SL SUBL
0.4000 mg | SUBLINGUAL_TABLET | SUBLINGUAL | Status: DC | PRN
  Administered 2015-02-09 (×2): 0.4 mg via SUBLINGUAL
  Filled 2015-02-09 (×2): qty 1

## 2015-02-09 MED ORDER — NITROGLYCERIN 2 % TD OINT
0.5000 [in_us] | TOPICAL_OINTMENT | Freq: Four times a day (QID) | TRANSDERMAL | Status: DC
  Administered 2015-02-10 (×3): 0.5 [in_us] via TOPICAL
  Filled 2015-02-09: qty 30

## 2015-02-09 MED ORDER — MORPHINE SULFATE 4 MG/ML IJ SOLN
4.0000 mg | Freq: Once | INTRAMUSCULAR | Status: AC
  Administered 2015-02-09: 4 mg via INTRAVENOUS
  Filled 2015-02-09: qty 1

## 2015-02-09 MED ORDER — ASPIRIN 81 MG PO CHEW
324.0000 mg | CHEWABLE_TABLET | Freq: Once | ORAL | Status: AC
  Administered 2015-02-09: 162 mg via ORAL
  Filled 2015-02-09: qty 4

## 2015-02-09 NOTE — ED Notes (Signed)
Pt. reports left chest pressure/heaviness onset this evening with SOB , denies nausea or diaphoresis . No cough or congestion / denies fever or chills.

## 2015-02-09 NOTE — ED Provider Notes (Signed)
CSN: [id number]     Arrival date & time 02/09/15  2041 History   First MD Initiated Contact with Patient 02/09/15 2100     Chief Complaint  Patient presents with  . Chest Pain     (Consider location/radiation/quality/duration/timing/severity/associated sxs/prior Treatment) Patient is a 59 y.o. male presenting with chest pain. The history is provided by the patient.  Chest Pain Pain location:  Substernal area Pain quality: pressure and tightness   Pain radiates to:  L arm Pain radiates to the back: no   Pain severity:  Moderate Onset quality:  Sudden Timing:  Constant Progression:  Unchanged Chronicity:  New Context: at rest (while driving)   Relieved by:  Nothing Worsened by:  Nothing tried Associated symptoms: shortness of breath   Associated symptoms: no cough, no diaphoresis, no fever and not vomiting     Past Medical History  Diagnosis Date  . Hypertension   . Hypercholesteremia   . Type II diabetes mellitus   . Heart murmur   . GERD (gastroesophageal reflux disease)   . Gout   . Taste absent 01/24/12    "had wisdom tooth pulled; couple days later 2 crowns put in; can't taste since"   Past Surgical History  Procedure Laterality Date  . No past surgeries     No family history on file. History  Substance Use Topics  . Smoking status: Current Every Day Smoker  . Smokeless tobacco: Current User  . Alcohol Use: Yes    Review of Systems  Constitutional: Negative for fever and diaphoresis.  Respiratory: Positive for shortness of breath. Negative for cough.   Cardiovascular: Positive for chest pain.  Gastrointestinal: Negative for vomiting.  All other systems reviewed and are negative.     Allergies  Sulfa antibiotics  Home Medications   Prior to Admission medications   Medication Sig Start Date End Date Taking? Authorizing Provider  amLODipine (NORVASC) 5 MG tablet Take 5 mg by mouth daily. for high blood pressure 01/01/15  Yes Historical Provider, MD   aspirin EC 81 MG tablet Take 81 mg by mouth daily.   Yes Historical Provider, MD  carvedilol (COREG) 12.5 MG tablet Take 12.5 mg by mouth 2 (two) times daily. 12/25/14  Yes Historical Provider, MD  esomeprazole (NEXIUM) 20 MG capsule Take 20 mg by mouth daily at 12 noon.   Yes Historical Provider, MD  LANTUS SOLOSTAR 100 UNIT/ML Solostar Pen Inject 63 Units into the skin at bedtime. 01/25/15  Yes Historical Provider, MD  lisinopril (PRINIVIL,ZESTRIL) 20 MG tablet Take 20 mg by mouth daily.   Yes Historical Provider, MD  rosuvastatin (CRESTOR) 5 MG tablet Take 5 mg by mouth daily. 01/10/15  Yes Historical Provider, MD  TANZEUM 30 MG PEN Inject 30 mg into the skin once a week. Takes on sundays. 12/27/14  Yes Historical Provider, MD  metFORMIN (GLUCOPHAGE) 500 MG tablet Take 1 tablet (500 mg total) by mouth 2 (two) times daily with a meal. Patient not taking: Reported on 02/09/2015 01/25/12   Acquanetta Chain, DO   BP 124/69 mmHg  Pulse 92  Temp(Src) 97.9 F (36.6 C) (Oral)  Resp 27  Ht 5\' 11"  (1.803 m)  Wt 225 lb (102.059 kg)  BMI 31.39 kg/m2  SpO2 97% Physical Exam  Constitutional: He is oriented to person, place, and time. He appears well-developed and well-nourished. No distress.  HENT:  Head: Normocephalic and atraumatic.  Mouth/Throat: No oropharyngeal exudate.  Eyes: EOM are normal. Pupils are equal, round, and reactive  to light.  Neck: Normal range of motion. Neck supple.  Cardiovascular: Normal rate and regular rhythm.  Exam reveals no friction rub.   No murmur heard. Pulmonary/Chest: Effort normal and breath sounds normal. No respiratory distress. He has no wheezes. He has no rales.  Abdominal: He exhibits no distension. There is no tenderness. There is no rebound.  Musculoskeletal: Normal range of motion. He exhibits no edema.  Neurological: He is alert and oriented to person, place, and time.  Skin: He is not diaphoretic.  Nursing note and vitals reviewed.   ED Course   Procedures (including critical care time) Labs Review Labs Reviewed  BASIC METABOLIC PANEL - Abnormal; Notable for the following:    Chloride 100 (*)    Glucose, Bld 189 (*)    All other components within normal limits  CBC  D-DIMER, QUANTITATIVE (NOT AT [facility])  Randolm Idol, ED    Imaging Review Dg Chest 2 View  02/09/2015   CLINICAL DATA:  Chest pain and shortness of breath for 1 day  EXAM: CHEST - 2 VIEW  COMPARISON:  01/24/2012  FINDINGS: Cardiac shadow is within normal limits. The lungs are well aerated bilaterally. Minimal left basilar atelectasis is seen. No sizable effusion is noted. No bony abnormality is noted.  IMPRESSION: Minimal left basilar atelectasis.   Electronically Signed   By: Inez Catalina M.D.   On: 02/09/2015 21:26     EKG Interpretation   Date/Time:  Wednesday February 09 2015 20:45:46 EDT Ventricular Rate:  81 PR Interval:  182 QRS Duration: 104 QT Interval:  384 QTC Calculation: 446 R Axis:   63 Text Interpretation:  Normal sinus rhythm Normal ECG No significant change  since last tracing Confirmed by Mingo Amber  MD, Yug Loria ([id number]) on 02/09/2015  9:08:58 PM      MDM   Final diagnoses:  Chest pain, unspecified chest pain type    59 year old male here for chest pain. Acute onset low driving tonight. He does drive a lot with work. Radiates to left arm. No diaphoresis, nausea, vomiting, but he does endorse shortness of breath. He well-appearing, is occasionally clutching his chest with pain. EKG is nonacute. This x-rays normal. Labs are normal including a d-dimer. Will admit for ACS rule out. He has also risk factors and a positive family history.  I spoke with Dr. Domenic Polite of Cardiology who will see the patient in consult.  Evelina Bucy, MD 02/09/15 [phone]

## 2015-02-09 NOTE — H&P (Addendum)
History and Physical  Gregory Fox OTL:[id number] DOB: 1956-03-03 DOA: 02/09/2015  Referring physician: Dr. Evelina Bucy, EDP PCP: [name], MD  Outpatient Specialists:  1. None  Chief Complaint: Chest pain radiating to left arm.  HPI: Gregory Fox is a 59 y.o. male with history of HTN, HLD, DM 2/IDDM, GERD, ongoing tobacco abuse-smokes and chews, normal Myoview July 2013, presented to [facility] ED on 02/09/15 with precordial chest pain radiating to left arm. Patient drives a lot for his work. He returned home at approximately 6 PM tonight and had subacute onset of precordial pressure/dull pain rated at 7/10 in severity, radiating to left upper extremity, associated with mild dyspnea but no nausea or diaphoresis. This pain was unlike his GERD pain. He proceeded to drink water and eat dinner without relief. At times chest pain seems worse with deep inspiration. No cough reported. No history of lifting or moving any heavy objects. Subsequently pain got worse to 9/10 in severity. He thereby presented to the ED. No complaints of asymmetrical leg pain or swelling. In the ED, sublingual nitroglycerin in did not provide relief and neither did a dose of IV morphine 4 mg 1. Subsequently received IV Dilaudid 1 MG 1 and pain subsided to 5/10 in severity. Not physically very active. Lab work significant for glucose 189 otherwise EKG without acute changes, d-dimer negative and POC troponin 1 negative. EDP consulted on-call cardiologist. Hospitalist admission requested.   Review of Systems: All systems reviewed and apart from history of presenting illness, are negative.  Past Medical History  Diagnosis Date  . Hypertension   . Hypercholesteremia   . Type II diabetes mellitus   . Heart murmur   . GERD (gastroesophageal reflux disease)   . Gout   . Taste absent 01/24/12    "had wisdom tooth pulled; couple days later 2 crowns put in; can't taste since"   Past Surgical History  Procedure  Laterality Date  . No past surgeries     Social History:  reports that he has been smoking.  He uses smokeless tobacco. He reports that he drinks about 4.2 oz of alcohol per week. He reports that he does not use illicit drugs. Married. Independent of activities of daily living. Chews tobacco since age 70. Smokes 3 cigarettes per day and drinks appear every night for the last couple of years. No recreational drug abuse.  Allergies  Allergen Reactions  . Sulfa Antibiotics Rash    Family History  Problem Relation Age of Onset  . CAD Father    father underwent CABG in his 59s.  Prior to Admission medications   Medication Sig Start Date End Date Taking? Authorizing Provider  amLODipine (NORVASC) 5 MG tablet Take 5 mg by mouth daily. for high blood pressure 01/01/15  Yes Historical Provider, MD  aspirin EC 81 MG tablet Take 81 mg by mouth daily.   Yes Historical Provider, MD  carvedilol (COREG) 12.5 MG tablet Take 12.5 mg by mouth 2 (two) times daily. 12/25/14  Yes Historical Provider, MD  esomeprazole (NEXIUM) 20 MG capsule Take 20 mg by mouth daily at 12 noon.   Yes Historical Provider, MD  LANTUS SOLOSTAR 100 UNIT/ML Solostar Pen Inject 63 Units into the skin at bedtime. 01/25/15  Yes Historical Provider, MD  lisinopril (PRINIVIL,ZESTRIL) 20 MG tablet Take 20 mg by mouth daily.   Yes Historical Provider, MD  rosuvastatin (CRESTOR) 5 MG tablet Take 5 mg by mouth daily. 01/10/15  Yes Historical Provider, MD  TANZEUM 30 MG PEN Inject 30 mg into the skin once a week. Takes on sundays. 12/27/14  Yes Historical Provider, MD   Physical Exam: Filed Vitals:   02/09/15 2230 02/09/15 2245 02/09/15 2300 02/09/15 2315  BP: 120/70 109/60 119/68 119/69  Pulse: 88 89 86 89  Temp:      TempSrc:      Resp:   14 15  Height:      Weight:      SpO2: 96% 97% 96% 94%   temperature 97.75F.   General exam: Moderately built and nourished pleasant middle-aged male patient, lying comfortably propped up on the  gurney in no obvious distress.  Head, eyes and ENT: Nontraumatic and normocephalic. Pupils equally reacting to light and accommodation. Oral mucosa moist.  Neck: Supple. No JVD, carotid bruit or thyromegaly.  Lymphatics: No lymphadenopathy.  Respiratory system: Clear to auscultation. No increased work of breathing. No reproducible chest pain.  Cardiovascular system: S1 and S2 heard, RRR. No JVD, murmurs, gallops, clicks or pedal edema.  Gastrointestinal system: Abdomen is nondistended, soft and nontender. Normal bowel sounds heard. No organomegaly or masses appreciated.  Central nervous system: Alert and oriented. No focal neurological deficits.  Extremities: Symmetric 5 x 5 power. Peripheral pulses symmetrically felt.   Skin: No rashes or acute findings.  Musculoskeletal system: Negative exam.  Psychiatry: Pleasant and cooperative.   Labs on Admission:  Basic Metabolic Panel:  Recent Labs Lab 02/09/15 2050  NA 136  K 3.6  CL 100*  CO2 26  GLUCOSE 189*  BUN 11  CREATININE 0.83  CALCIUM 8.9   Liver Function Tests: No results for input(s): AST, ALT, ALKPHOS, BILITOT, PROT, ALBUMIN in the last 168 hours. No results for input(s): LIPASE, AMYLASE in the last 168 hours. No results for input(s): AMMONIA in the last 168 hours. CBC:  Recent Labs Lab 02/09/15 2050  WBC 9.6  HGB 15.7  HCT 44.5  MCV 87.8  PLT 208   Cardiac Enzymes: No results for input(s): CKTOTAL, CKMB, CKMBINDEX, TROPONINI in the last 168 hours.  BNP (last 3 results) No results for input(s): PROBNP in the last 8760 hours. CBG: No results for input(s): GLUCAP in the last 168 hours.  Radiological Exams on Admission: Dg Chest 2 View  02/09/2015   CLINICAL DATA:  Chest pain and shortness of breath for 1 day  EXAM: CHEST - 2 VIEW  COMPARISON:  01/24/2012  FINDINGS: Cardiac shadow is within normal limits. The lungs are well aerated bilaterally. Minimal left basilar atelectasis is seen. No sizable  effusion is noted. No bony abnormality is noted.  IMPRESSION: Minimal left basilar atelectasis.   Electronically Signed   By: Inez Catalina M.D.   On: 02/09/2015 21:26    EKG: Independently reviewed. Sinus rhythm, normal axis and no acute changes. QTC 446 ms.  Assessment/Plan Principal Problem:   Chest pain Active Problems:   GERD (gastroesophageal reflux disease)   Hyperlipidemia   Hypertension   Type II diabetes mellitus   Tobacco abuse-smokes and chews   Chest pain radiating to arm   Chest pain with radiation to left arm,? Unstable angina - Admit to telemetry - EKG without acute changes. POC troponin 1 negative. D-dimer negative - Despite having chest pain for a couple of hours, negative troponin is reassuring. - Cycle troponin, serial EKGs - Continue aspirin and home dose of beta blockers - Add transdermal nitroglycerin - EDP discussed with cardiology: In the absence of acute EKG changes, negative troponin, advised to hold  IV heparin infusion until there evaluation. - Nothing by mouth after midnight for possible cardiac Cath in a.m. - CAD risk factors: HTN, DM 2, HDL, tobacco abuse and family history. Heart Score: 6.  Essential hypertension - Controlled on carvedilol, amlodipine and lisinopril  Hyperlipidemia - Continue Crestor  Uncontrolled type II DM/IDDM - Continue home dose of Lantus. Add NovoLog SSI.  Tobacco abuse - Cessation counseling  GERD - Continue PPI    DVT prophylaxis: Subcutaneous Lovenox Code Status: Full  Family Communication: Discussed with patient spouse and son at bedside  Disposition Plan: DC home when medically stable   Time spent: 85 minutes  Vernon Maish, MD, FACP, FHM. [facility] Pager [phone]  If 7PM-7AM, please contact night-coverage [email or web address] Password [email or web address] 02/09/2015, 11:21 PM

## 2015-02-10 ENCOUNTER — Ambulatory Visit (HOSPITAL_COMMUNITY): Payer: BC Managed Care – PPO

## 2015-02-10 ENCOUNTER — Observation Stay (HOSPITAL_COMMUNITY): Payer: BC Managed Care – PPO

## 2015-02-10 ENCOUNTER — Ambulatory Visit (HOSPITAL_BASED_OUTPATIENT_CLINIC_OR_DEPARTMENT_OTHER): Payer: BC Managed Care – PPO

## 2015-02-10 DIAGNOSIS — R079 Chest pain, unspecified: Secondary | ICD-10-CM | POA: Diagnosis not present

## 2015-02-10 DIAGNOSIS — E119 Type 2 diabetes mellitus without complications: Secondary | ICD-10-CM | POA: Diagnosis not present

## 2015-02-10 DIAGNOSIS — R0789 Other chest pain: Secondary | ICD-10-CM

## 2015-02-10 DIAGNOSIS — R071 Chest pain on breathing: Secondary | ICD-10-CM | POA: Diagnosis not present

## 2015-02-10 DIAGNOSIS — I10 Essential (primary) hypertension: Secondary | ICD-10-CM | POA: Diagnosis not present

## 2015-02-10 DIAGNOSIS — E118 Type 2 diabetes mellitus with unspecified complications: Secondary | ICD-10-CM | POA: Diagnosis not present

## 2015-02-10 DIAGNOSIS — R509 Fever, unspecified: Secondary | ICD-10-CM | POA: Diagnosis not present

## 2015-02-10 DIAGNOSIS — R0602 Shortness of breath: Secondary | ICD-10-CM | POA: Diagnosis not present

## 2015-02-10 LAB — TROPONIN I
Troponin I: <0.03 ng/mL (ref <0.031)
Troponin I: <0.03 ng/mL (ref <0.031)
Troponin I: <0.03 ng/mL (ref <0.031)

## 2015-02-10 LAB — URINALYSIS, ROUTINE W REFLEX MICROSCOPIC
Bilirubin Urine: NEGATIVE
Glucose, UA: NEGATIVE mg/dL
HGB URINE DIPSTICK: NEGATIVE
Ketones, ur: NEGATIVE mg/dL
Leukocytes, UA: NEGATIVE
Nitrite: NEGATIVE
Protein, ur: NEGATIVE mg/dL
Specific Gravity, Urine: 1.017 (ref 1.005–1.030)
UROBILINOGEN UA: 1 mg/dL (ref 0.0–1.0)
pH: 6.5 (ref 5.0–8.0)

## 2015-02-10 LAB — CBC
HCT: 40.5 % (ref 39.0–52.0)
Hemoglobin: 14 g/dL (ref 13.0–17.0)
MCH: 30.7 pg (ref 26.0–34.0)
MCHC: 34.6 g/dL (ref 30.0–36.0)
MCV: 88.8 fL (ref 78.0–100.0)
Platelets: 173 10*3/uL (ref 150–400)
RBC: 4.56 MIL/uL (ref 4.22–5.81)
RDW: 12.9 % (ref 11.5–15.5)
WBC: 8 10*3/uL (ref 4.0–10.5)

## 2015-02-10 LAB — GLUCOSE, CAPILLARY
GLUCOSE-CAPILLARY: 116 mg/dL — AB (ref 65–99)
GLUCOSE-CAPILLARY: 157 mg/dL — AB (ref 65–99)
Glucose-Capillary: 116 mg/dL — ABNORMAL HIGH (ref 65–99)
Glucose-Capillary: 125 mg/dL — ABNORMAL HIGH (ref 65–99)
Glucose-Capillary: 151 mg/dL — ABNORMAL HIGH (ref 65–99)

## 2015-02-10 LAB — HEPARIN LEVEL (UNFRACTIONATED)
HEPARIN UNFRACTIONATED: 0.14 [IU]/mL — AB (ref 0.30–0.70)
Heparin Unfractionated: 0.23 IU/mL — ABNORMAL LOW (ref 0.30–0.70)

## 2015-02-10 LAB — TSH: TSH: 1.767 u[IU]/mL (ref 0.350–4.500)

## 2015-02-10 MED ORDER — LISINOPRIL 20 MG PO TABS
20.0000 mg | ORAL_TABLET | Freq: Every day | ORAL | Status: DC
Start: 2015-02-10 — End: 2015-02-12
  Administered 2015-02-10 – 2015-02-12 (×3): 20 mg via ORAL
  Filled 2015-02-10: qty 1
  Filled 2015-02-10: qty 2
  Filled 2015-02-10: qty 1

## 2015-02-10 MED ORDER — HEPARIN BOLUS VIA INFUSION
3000.0000 [IU] | Freq: Once | INTRAVENOUS | Status: AC
  Administered 2015-02-10: 3000 [IU] via INTRAVENOUS
  Filled 2015-02-10: qty 3000

## 2015-02-10 MED ORDER — INSULIN ASPART 100 UNIT/ML ~~LOC~~ SOLN: 0.0000 [IU] | Freq: Every day | SUBCUTANEOUS | Status: DC

## 2015-02-10 MED ORDER — HEPARIN (PORCINE) IN NACL 100-0.45 UNIT/ML-% IJ SOLN
1900.0000 [IU]/h | INTRAMUSCULAR | Status: DC
  Administered 2015-02-10: 1400 [IU]/h via INTRAVENOUS
  Administered 2015-02-10: 1600 [IU]/h via INTRAVENOUS
  Administered 2015-02-11: 1900 [IU]/h via INTRAVENOUS
  Filled 2015-02-10 (×2): qty 250

## 2015-02-10 MED ORDER — HEPARIN BOLUS VIA INFUSION
4000.0000 [IU] | Freq: Once | INTRAVENOUS | Status: AC
  Administered 2015-02-10: 4000 [IU] via INTRAVENOUS
  Filled 2015-02-10: qty 4000

## 2015-02-10 MED ORDER — ROSUVASTATIN CALCIUM 10 MG PO TABS
5.0000 mg | ORAL_TABLET | Freq: Every day | ORAL | Status: DC
  Administered 2015-02-10 – 2015-02-12 (×3): 5 mg via ORAL
  Filled 2015-02-10 (×3): qty 1

## 2015-02-10 MED ORDER — MORPHINE SULFATE 4 MG/ML IJ SOLN
4.0000 mg | INTRAMUSCULAR | Status: DC | PRN
  Administered 2015-02-10 (×3): 4 mg via INTRAVENOUS
  Filled 2015-02-10 (×3): qty 1

## 2015-02-10 MED ORDER — ACETAMINOPHEN 325 MG PO TABS
650.0000 mg | ORAL_TABLET | Freq: Four times a day (QID) | ORAL | Status: DC | PRN
  Administered 2015-02-10: 650 mg via ORAL
  Filled 2015-02-10: qty 2

## 2015-02-10 MED ORDER — INSULIN GLARGINE 100 UNIT/ML ~~LOC~~ SOLN
55.0000 [IU] | Freq: Every day | SUBCUTANEOUS | Status: DC
  Administered 2015-02-10: 25 [IU] via SUBCUTANEOUS
  Administered 2015-02-11: 55 [IU] via SUBCUTANEOUS
  Filled 2015-02-10 (×3): qty 0.55

## 2015-02-10 MED ORDER — ONDANSETRON HCL 4 MG PO TABS: 4.0000 mg | ORAL_TABLET | Freq: Four times a day (QID) | ORAL | Status: DC | PRN

## 2015-02-10 MED ORDER — ALBUTEROL SULFATE (2.5 MG/3ML) 0.083% IN NEBU: 2.5000 mg | INHALATION_SOLUTION | RESPIRATORY_TRACT | Status: DC | PRN

## 2015-02-10 MED ORDER — INSULIN GLARGINE 100 UNIT/ML ~~LOC~~ SOLN
63.0000 [IU] | Freq: Every day | SUBCUTANEOUS | Status: DC
  Filled 2015-02-10: qty 0.63

## 2015-02-10 MED ORDER — PANTOPRAZOLE SODIUM 40 MG PO TBEC
40.0000 mg | DELAYED_RELEASE_TABLET | Freq: Every day | ORAL | Status: DC
  Administered 2015-02-10 – 2015-02-12 (×3): 40 mg via ORAL
  Filled 2015-02-10 (×3): qty 1

## 2015-02-10 MED ORDER — CARVEDILOL 12.5 MG PO TABS
12.5000 mg | ORAL_TABLET | Freq: Two times a day (BID) | ORAL | Status: DC
  Administered 2015-02-10 – 2015-02-12 (×4): 12.5 mg via ORAL
  Filled 2015-02-10 (×5): qty 1

## 2015-02-10 MED ORDER — ONDANSETRON HCL 4 MG/2ML IJ SOLN: 4.0000 mg | Freq: Four times a day (QID) | INTRAMUSCULAR | Status: DC | PRN

## 2015-02-10 MED ORDER — AMLODIPINE BESYLATE 5 MG PO TABS
5.0000 mg | ORAL_TABLET | Freq: Every day | ORAL | Status: DC
  Administered 2015-02-10 – 2015-02-12 (×3): 5 mg via ORAL
  Filled 2015-02-10 (×3): qty 1

## 2015-02-10 MED ORDER — SODIUM CHLORIDE 0.9 % IJ SOLN
3.0000 mL | Freq: Two times a day (BID) | INTRAMUSCULAR | Status: DC
  Administered 2015-02-10 – 2015-02-11 (×3): 3 mL via INTRAVENOUS

## 2015-02-10 MED ORDER — ALUM & MAG HYDROXIDE-SIMETH 200-200-20 MG/5ML PO SUSP
30.0000 mL | Freq: Four times a day (QID) | ORAL | Status: DC | PRN
Start: 2015-02-10 — End: 2015-02-12

## 2015-02-10 MED ORDER — ENOXAPARIN SODIUM 40 MG/0.4ML ~~LOC~~ SOLN: 40.0000 mg | SUBCUTANEOUS | Status: DC

## 2015-02-10 MED ORDER — INSULIN ASPART 100 UNIT/ML ~~LOC~~ SOLN
0.0000 [IU] | Freq: Three times a day (TID) | SUBCUTANEOUS | Status: DC
  Administered 2015-02-10: 2 [IU] via SUBCUTANEOUS
  Administered 2015-02-11: 1 [IU] via SUBCUTANEOUS
  Administered 2015-02-11: 2 [IU] via SUBCUTANEOUS

## 2015-02-10 MED ORDER — ACETAMINOPHEN 650 MG RE SUPP: 650.0000 mg | Freq: Four times a day (QID) | RECTAL | Status: DC | PRN

## 2015-02-10 MED ORDER — SODIUM CHLORIDE 0.9 % IV BOLUS (SEPSIS)
500.0000 mL | Freq: Once | INTRAVENOUS | Status: AC
  Administered 2015-02-10: 500 mL via INTRAVENOUS

## 2015-02-10 MED ORDER — ASPIRIN EC 325 MG PO TBEC
325.0000 mg | DELAYED_RELEASE_TABLET | Freq: Every day | ORAL | Status: DC
  Administered 2015-02-10 – 2015-02-12 (×3): 325 mg via ORAL
  Filled 2015-02-10 (×3): qty 1

## 2015-02-10 NOTE — Progress Notes (Signed)
ANTICOAGULATION CONSULT NOTE - Follow Up  Pharmacy Consult for heparin Indication: USAP  Allergies  Allergen Reactions  . Sulfa Antibiotics Rash    Patient Measurements: Height: 5\' 11"  (180.3 cm) Weight: 227 lb 8 oz (103.193 kg) IBW/kg (Calculated) : 75.3 Heparin Dosing Weight: 100kg  Vital Signs: Temp: 99.4 F (37.4 C) (07/21 0555) Temp Source: Oral (07/21 0555) BP: 116/69 mmHg (07/21 0941) Pulse Rate: 80 (07/21 0841)  Labs:  Recent Labs  02/09/15 2050 02/09/15 2358 02/10/15 0755  HGB 15.7  --   --   HCT 44.5  --   --   PLT 208  --   --   HEPARINUNFRC  --   --  0.23*  CREATININE 0.83  --   --   TROPONINI  --  <0.03  --     Estimated Creatinine Clearance: 114.3 mL/min (by C-G formula based on Cr of 0.83).   Assessment: 59yo male c/o chest pressure/heaviness associated w/ SOB. Troponin negative, EKG normal. Heparin initiated for unstable angina and possible cath. Initial HL 0.23 which is below goal. Plan on stress test tomorrow if chest pain improves and normal echo results.   Goal of Therapy:  Heparin level 0.3-0.7 units/ml Monitor platelets by anticoagulation protocol: Yes   Plan:  Will increase heparin infusion to 1600 units/hr. F/U HL 7/21 at 1730 Monitor heparin levels, CBC, and ssx bleeding.  Guy Begin, PharmD  02/10/2015,10:53 AM

## 2015-02-10 NOTE — Progress Notes (Signed)
[facility] PROGRESS NOTE Assessment/Plan: Chest pain radiating to arm: - Troponins negative 1, EKG showed no ischemic changes. - Pain worse with a deep breath, not better with sitting up or worst with laying down. - Echo result is pending. He has multiple risk factors he's got better, with history of hypertension, hyperlipidemia and an ongoing smoker. - Patient cardiology's assistance if he rules out will probably need a stress test in the morning placement by mouth. - He is currently on aspirin, heparin, statin, beta blocker and nitroglycerin when necessary.  Tobacco abuse-smokes and chews  Type II diabetes mellitus - Start him on a heart healthy diet, nothing by mouth after midnight for possible stress test. - Continue to hold his metformin, start sliding scale insulin.   GERD (gastroesophageal reflux disease)  Hyperlipidemia:  Essential  Hypertension: - Seems to be stable continue current regimen.  Code Status: full Family Communication: none  Disposition Plan: home in am after Stres test.   Consultants:  Cardiology  Procedures:  Echo  Antibiotics:  None  HPI/Subjective: Relates chest pain-free no shortness of breath.  Objective: Filed Vitals:   02/09/15 2300 02/09/15 2315 02/10/15 0011 02/10/15 0555  BP: 119/68 119/69 124/67 104/50  Pulse: 86 89 92 83  Temp:   99.8 F (37.7 C) 99.4 F (37.4 C)  TempSrc:   Oral Oral  Resp: 14 15    Height:   5\' 11"  (1.803 m)   Weight:   103.193 kg (227 lb 8 oz)   SpO2: 96% 94% 96% 95%   No intake or output data in the 24 hours ending 02/10/15 0854 Filed Weights   02/09/15 2047 02/10/15 0011  Weight: 102.059 kg (225 lb) 103.193 kg (227 lb 8 oz)    Exam:  General: Alert, awake, oriented x3, in no acute distress.  HEENT: No bruits, no goiter.  Heart: Regular rate and rhythm. Lungs: Good air movement, Clear Abdomen: Soft, nontender, nondistended, positive bowel sounds.  Neuro: Grossly intact,  nonfocal.   Data Reviewed: Basic Metabolic Panel:  Recent Labs Lab 02/09/15 2050  NA 136  K 3.6  CL 100*  CO2 26  GLUCOSE 189*  BUN 11  CREATININE 0.83  CALCIUM 8.9   Liver Function Tests: No results for input(s): AST, ALT, ALKPHOS, BILITOT, PROT, ALBUMIN in the last 168 hours. No results for input(s): LIPASE, AMYLASE in the last 168 hours. No results for input(s): AMMONIA in the last 168 hours. CBC:  Recent Labs Lab 02/09/15 2050  WBC 9.6  HGB 15.7  HCT 44.5  MCV 87.8  PLT 208   Cardiac Enzymes:  Recent Labs Lab 02/09/15 2358  TROPONINI <0.03   BNP (last 3 results) No results for input(s): BNP in the last 8760 hours.  ProBNP (last 3 results) No results for input(s): PROBNP in the last 8760 hours.  CBG:  Recent Labs Lab 02/10/15 0039 02/10/15 0730  GLUCAP 125* 116*    No results found for this or any previous visit (from the past 240 hour(s)).   Studies: Dg Chest 2 View  02/09/2015   CLINICAL DATA:  Chest pain and shortness of breath for 1 day  EXAM: CHEST - 2 VIEW  COMPARISON:  01/24/2012  FINDINGS: Cardiac shadow is within normal limits. The lungs are well aerated bilaterally. Minimal left basilar atelectasis is seen. No sizable effusion is noted. No bony abnormality is noted.  IMPRESSION: Minimal left basilar atelectasis.   Electronically Signed   By: Inez Catalina M.D.   On:  02/09/2015 21:26    Scheduled Meds: . amLODipine  5 mg Oral Daily  . aspirin EC  325 mg Oral Daily  . carvedilol  12.5 mg Oral BID WC  . insulin aspart  0-5 Units Subcutaneous QHS  . insulin aspart  0-9 Units Subcutaneous TID WC  . insulin glargine  63 Units Subcutaneous QHS  . lisinopril  20 mg Oral Daily  . nitroGLYCERIN  0.5 inch Topical 4 times per day  . pantoprazole  40 mg Oral Daily  . rosuvastatin  5 mg Oral Daily  . sodium chloride  3 mL Intravenous Q12H   Continuous Infusions: . heparin 1,400 Units/hr (02/10/15 0115)    Time Spent: 25 min   FELIZ  Marguarite Arbour  [facility] Pager [phone]. If 7PM-7AM, please contact night-coverage at [email or web address], password [facility] 02/10/2015, 8:54 AM

## 2015-02-10 NOTE — Progress Notes (Signed)
Pt has elevated temp. =101.1, and also complaining of a slight headache, he received Tylenol 650 mg PO per PRN order, Md notified.  Ferdinand Lango, RN

## 2015-02-10 NOTE — Progress Notes (Signed)
ANTICOAGULATION CONSULT NOTE - Initial Consult  Pharmacy Consult for heparin Indication: USAP  Allergies  Allergen Reactions  . Sulfa Antibiotics Rash    Patient Measurements: Height: 5\' 11"  (180.3 cm) Weight: 227 lb 8 oz (103.193 kg) IBW/kg (Calculated) : 75.3 Heparin Dosing Weight: 100kg  Vital Signs: Temp: 99.8 F (37.7 C) (07/21 0011) Temp Source: Oral (07/21 0011) BP: 124/67 mmHg (07/21 0011) Pulse Rate: 92 (07/21 0011)  Labs:  Recent Labs  02/09/15 2050 02/09/15 2358  HGB 15.7  --   HCT 44.5  --   PLT 208  --   CREATININE 0.83  --   TROPONINI  --  <0.03    Estimated Creatinine Clearance: 114.3 mL/min (by C-G formula based on Cr of 0.83).   Medical History: Past Medical History  Diagnosis Date  . Hypertension   . Hypercholesteremia   . Type II diabetes mellitus   . Heart murmur   . GERD (gastroesophageal reflux disease)   . Gout   . Taste absent 01/24/12    "had wisdom tooth pulled; couple days later 2 crowns put in; can't taste since"    Medications:  Prescriptions prior to admission  Medication Sig Dispense Refill Last Dose  . amLODipine (NORVASC) 5 MG tablet Take 5 mg by mouth daily. for high blood pressure  1 02/09/2015 at Unknown time  . aspirin EC 81 MG tablet Take 81 mg by mouth daily.   02/09/2015 at Unknown time  . carvedilol (COREG) 12.5 MG tablet Take 12.5 mg by mouth 2 (two) times daily.  0 02/09/2015 at 1800  . esomeprazole (NEXIUM) 20 MG capsule Take 20 mg by mouth daily at 12 noon.   02/09/2015 at Unknown time  . LANTUS SOLOSTAR 100 UNIT/ML Solostar Pen Inject 63 Units into the skin at bedtime.  5 02/09/2015 at Unknown time  . lisinopril (PRINIVIL,ZESTRIL) 20 MG tablet Take 20 mg by mouth daily.   02/09/2015 at Unknown time  . rosuvastatin (CRESTOR) 5 MG tablet Take 5 mg by mouth daily.  1 02/09/2015 at Unknown time  . TANZEUM 30 MG PEN Inject 30 mg into the skin once a week. Takes on sundays.   02/06/2015   Scheduled:  . amLODipine  5 mg  Oral Daily  . aspirin EC  325 mg Oral Daily  . carvedilol  12.5 mg Oral BID WC  . enoxaparin (LOVENOX) injection  40 mg Subcutaneous Q24H  . insulin aspart  0-5 Units Subcutaneous QHS  . insulin aspart  0-9 Units Subcutaneous TID WC  . insulin glargine  63 Units Subcutaneous QHS  . lisinopril  20 mg Oral Daily  . nitroGLYCERIN  0.5 inch Topical 4 times per day  . pantoprazole  40 mg Oral Daily  . rosuvastatin  5 mg Oral Daily  . sodium chloride  3 mL Intravenous Q12H    Assessment: 59yo male c/o chest pressure/heaviness associated w/ SOB, initial labs WNL, to begin heparin for unstable angina.  Goal of Therapy:  Heparin level 0.3-0.7 units/ml Monitor platelets by anticoagulation protocol: Yes   Plan:  Will give heparin 4000 units x1 followed by gtt at 1400 units/hr and monitor heparin levels and CBC.  Wynona Neat, PharmD, BCPS  02/10/2015,1:01 AM

## 2015-02-10 NOTE — Consult Note (Addendum)
Referring Physician: Lavella Lemons PCP: [name], MD Reason for Consultation: CP   HPI:  Gregory Fox is a 59 y.o. male with history of HTN, HLD, DM 2/IDDM, GERD, ongoing tobacco abuse-smokes and chews, normal Myoview July 2013. We are asked to evaluate for CP.   Denies h/o known CAD. Had palpitations in 2013 and had normal Myoview. Works as Hotel manager. Was in car for 9 hours today (got out multiple times). He returned home at approximately 6 PM tonight and had subacute onset of precordial pressure/dull pain rated at 7/10 in severity, radiating to left upper extremity, associated with mild dyspnea but no nausea or diaphoresis. This pain was unlike his GERD pain. He proceeded to drink water and eat dinner without relief. Chest pain worse with deep inspiration. No change with exertion. No LE swelling.  Denies fevers, chills or cough reported.Subsequently pain got worse to 9/10 in severity. He thereby presented to the ED. In the ED, sublingual nitroglycerin in did not provide relief and neither did a dose of IV morphine 4 mg 1. Subsequently received IV Dilaudid 1 MG 1 and pain improved but not totally resolved. Deep breathing only thin that makes it worse. EKG and CXR normal, d-dimer negative and POC troponin 1 negative.    Review of Systems:     Cardiac Review of Systems: {Y] = yes [ ]  = no  Chest Pain [  y  ]  Resting SOB [   ] Exertional SOB  [  ]  Orthopnea [  ]   Pedal Edema [   ]    Palpitations [  ] Syncope  [  ]   Presyncope [   ]  General Review of Systems: [Y] = yes [  ]=no Constitional: recent weight change [  ]; anorexia [  ]; fatigue [  ]; nausea [  ]; night sweats [  ]; fever [  ]; or chills [  ];                                                                     Eyes : blurred vision [  ]; diplopia [   ]; vision changes [  ];  Amaurosis fugax[  ]; Resp: cough [  ];  wheezing[  ];  hemoptysis[  ];  PND [  ];  GI:  gallstones[  ], vomiting[  ];  dysphagia[  ]; melena[  ];   hematochezia [  ]; heartburn[  ];   GU: kidney stones [  ]; hematuria[  ];   dysuria [  ];  nocturia[  ]; incontinence [  ];             Skin: rash, swelling[  ];, hair loss[  ];  peripheral edema[  ];  or itching[  ]; Musculosketetal: myalgias[  ];  joint swelling[  ];  joint erythema[  ];  joint pain[  ];  back pain[  ];  Heme/Lymph: bruising[  ];  bleeding[  ];  anemia[  ];  Neuro: TIA[  ];  headaches[  ];  stroke[  ];  vertigo[  ];  seizures[  ];   paresthesias[  ];  difficulty walking[  ];  Psych:depression[  ]; anxiety[  ];  Endocrine:  diabetes[ y ];  thyroid dysfunction[  ];  Other:  Past Medical History  Diagnosis Date  . Hypertension   . Hypercholesteremia   . Type II diabetes mellitus   . Heart murmur   . GERD (gastroesophageal reflux disease)   . Gout   . Taste absent 01/24/12    "had wisdom tooth pulled; couple days later 2 crowns put in; can't taste since"    Medications Prior to Admission  Medication Sig Dispense Refill  . amLODipine (NORVASC) 5 MG tablet Take 5 mg by mouth daily. for high blood pressure  1  . aspirin EC 81 MG tablet Take 81 mg by mouth daily.    . carvedilol (COREG) 12.5 MG tablet Take 12.5 mg by mouth 2 (two) times daily.  0  . esomeprazole (NEXIUM) 20 MG capsule Take 20 mg by mouth daily at 12 noon.    Marland Kitchen LANTUS SOLOSTAR 100 UNIT/ML Solostar Pen Inject 63 Units into the skin at bedtime.  5  . lisinopril (PRINIVIL,ZESTRIL) 20 MG tablet Take 20 mg by mouth daily.    . rosuvastatin (CRESTOR) 5 MG tablet Take 5 mg by mouth daily.  1  . TANZEUM 30 MG PEN Inject 30 mg into the skin once a week. Takes on sundays.       Marland Kitchen amLODipine  5 mg Oral Daily  . aspirin EC  325 mg Oral Daily  . carvedilol  12.5 mg Oral BID WC  . enoxaparin (LOVENOX) injection  40 mg Subcutaneous Q24H  . insulin aspart  0-5 Units Subcutaneous QHS  . insulin aspart  0-9 Units Subcutaneous TID WC  . insulin glargine  63 Units Subcutaneous QHS  . lisinopril  20 mg Oral Daily  .  nitroGLYCERIN  0.5 inch Topical 4 times per day  . pantoprazole  40 mg Oral Daily  . rosuvastatin  5 mg Oral Daily  . sodium chloride  3 mL Intravenous Q12H    Infusions:    Allergies  Allergen Reactions  . Sulfa Antibiotics Rash    History   Social History  . Marital Status: Married    Spouse Name: N/A  . Number of Children: N/A  . Years of Education: N/A   Occupational History  . Not on file.   Social History Main Topics  . Smoking status: Current Every Day Smoker -- 0.25 packs/day  . Smokeless tobacco: Current User  . Alcohol Use: 4.2 oz/week    7 Cans of beer per week  . Drug Use: No  . Sexual Activity: Not on file   Other Topics Concern  . Not on file   Social History Narrative    Family History  Problem Relation Age of Onset  . CAD Father   Mother had rheumatic fever - deceased Father with CAD in 63s had CABG. -- deceased  PHYSICAL EXAM: Filed Vitals:   02/10/15 0011  BP: 124/67  Pulse: 92  Temp: 99.8 F (37.7 C)  Resp:     No intake or output data in the 24 hours ending 02/10/15 0100  General:  Well appearing. No respiratory difficulty HEENT: normal Neck: supple. no JVD. Carotids 2+ bilat; no bruits. No lymphadenopathy or thryomegaly appreciated. Cor: PMI nondisplaced. Regular rate & rhythm. No rubs, gallops or murmurs. Lungs: clear Abdomen: soft, nontender, nondistended. No hepatosplenomegaly. No bruits or masses. Good bowel sounds. Extremities: no cyanosis, clubbing, rash, edema Neuro: alert & oriented x 3, cranial nerves grossly intact. moves all 4 extremities w/o difficulty. Affect pleasant.  ECG: NSR No ST-T wave abnormalities.    Results for orders placed or performed during the hospital encounter of 02/09/15 (from the past 24 hour(s))  Basic metabolic panel     Status: Abnormal   Collection Time: 02/09/15  8:50 PM  Result Value Ref Range   Sodium 136 135 - 145 mmol/L   Potassium 3.6 3.5 - 5.1 mmol/L   Chloride 100 (L) 101 - 111  mmol/L   CO2 26 22 - 32 mmol/L   Glucose, Bld 189 (H) 65 - 99 mg/dL   BUN 11 6 - 20 mg/dL   Creatinine, Ser 0.83 0.61 - 1.24 mg/dL   Calcium 8.9 8.9 - 10.3 mg/dL   GFR calc non Af Amer >60 >60 mL/min   GFR calc Af Amer >60 >60 mL/min   Anion gap 10 5 - 15  CBC     Status: None   Collection Time: 02/09/15  8:50 PM  Result Value Ref Range   WBC 9.6 4.0 - 10.5 K/uL   RBC 5.07 4.22 - 5.81 MIL/uL   Hemoglobin 15.7 13.0 - 17.0 g/dL   HCT 44.5 39.0 - 52.0 %   MCV 87.8 78.0 - 100.0 fL   MCH 31.0 26.0 - 34.0 pg   MCHC 35.3 30.0 - 36.0 g/dL   RDW 12.8 11.5 - 15.5 %   Platelets 208 150 - 400 K/uL  D-dimer, quantitative (not at [facility])     Status: None   Collection Time: 02/09/15  8:50 PM  Result Value Ref Range   D-Dimer, Quant <0.27 0.00 - 0.48 ug/mL-FEU  I-stat troponin, ED     Status: None   Collection Time: 02/09/15  8:57 PM  Result Value Ref Range   Troponin i, poc 0.00 0.00 - 0.08 ng/mL   Comment 3          Troponin I     Status: None   Collection Time: 02/09/15 11:58 PM  Result Value Ref Range   Troponin I <0.03 <0.031 ng/mL  Glucose, capillary     Status: Abnormal   Collection Time: 02/10/15 12:39 AM  Result Value Ref Range   Glucose-Capillary 125 (H) 65 - 99 mg/dL   Dg Chest 2 View  02/09/2015   CLINICAL DATA:  Chest pain and shortness of breath for 1 day  EXAM: CHEST - 2 VIEW  COMPARISON:  01/24/2012  FINDINGS: Cardiac shadow is within normal limits. The lungs are well aerated bilaterally. Minimal left basilar atelectasis is seen. No sizable effusion is noted. No bony abnormality is noted.  IMPRESSION: Minimal left basilar atelectasis.   Electronically Signed   By: Inez Catalina M.D.   On: 02/09/2015 21:26     ASSESSMENT: 1. Pleuritic CP 2. DM2 x 8 years 3 Tobacco use 4. HTN  5. HL   PLAN/DISCUSSION:  CP is mostly pleuritic in nature and my initial suspicion was for PE but d-dimer is normal and he is not tachypneic or tachycardic. Given CRFs, ischemia also a  consideration but pain not exertional and ECG and troponin normal. Will cycle cardiac markers and check echo. If CP improves and echo and CEs normal can proceed with stress test otherwise would lean toward cath. Will start heparin until he rules out. Treat with ASA, b-blocker and statin. Counseled on smoking cessation.  Bensimhon, Oluwasemilore,MD 1:07 AM

## 2015-02-10 NOTE — Progress Notes (Addendum)
ANTICOAGULATION CONSULT NOTE - Follow Up  Pharmacy Consult for heparin Indication: USAP  Allergies  Allergen Reactions  . Sulfa Antibiotics Rash    Patient Measurements: Height: 5\' 11"  (180.3 cm) Weight: 227 lb 8 oz (103.193 kg) IBW/kg (Calculated) : 75.3 Heparin Dosing Weight: 100kg  Vital Signs: Temp: 100.2 F (37.9 C) (07/21 1757) Temp Source: Oral (07/21 1757) BP: 107/48 mmHg (07/21 1757) Pulse Rate: 90 (07/21 1757)  Labs:  Recent Labs  02/09/15 2050 02/09/15 2358 02/10/15 0755 02/10/15 1215 02/10/15 1739  HGB 15.7  --   --  14.0  --   HCT 44.5  --   --  40.5  --   PLT 208  --   --  173  --   HEPARINUNFRC  --   --  0.23*  --  0.14*  CREATININE 0.83  --   --   --   --   TROPONINI  --  <0.03  --  <0.03 <0.03    Estimated Creatinine Clearance: 114.3 mL/min (by C-G formula based on Cr of 0.83).   Assessment: 59yo male c/o chest pressure/heaviness associated w/ SOB. Troponin negative, EKG normal. Heparin initiated for unstable angina and possible cath.  Plan on stress test tomorrow if chest pain improves and normal echo results. Heparin level has declined from 0.23>0.14. 2nd shift RN found rate running at 1400 units/hr instead of the 1600 units/hr that it should have been increased to this AM.  Goal of Therapy:  Heparin level 0.3-0.7 units/ml Monitor platelets by anticoagulation protocol: Yes   Plan:  Will increase heparin infusion to 1700 units/hr. Bolus heparin 3000 units.  Sehaj Kolden S. Alford Highland, PharmD, [place] Clinical Staff Pharmacist Pager [phone]   02/10/2015,7:11 PM

## 2015-02-10 NOTE — Progress Notes (Signed)
  Echocardiogram 2D Echocardiogram has been performed.  Gregory Fox 02/10/2015, 5:08 PM

## 2015-02-11 ENCOUNTER — Encounter (HOSPITAL_COMMUNITY): Payer: Self-pay

## 2015-02-11 ENCOUNTER — Encounter (HOSPITAL_COMMUNITY)
Admit: 2015-02-11 | Discharge: 2015-02-11 | Disposition: A | Discharge status: Home / Self Care | Payer: BC Managed Care – PPO | Attending: Cardiology | Admitting: Cardiology

## 2015-02-11 ENCOUNTER — Encounter (HOSPITAL_COMMUNITY): Payer: BC Managed Care – PPO

## 2015-02-11 ENCOUNTER — Observation Stay (HOSPITAL_BASED_OUTPATIENT_CLINIC_OR_DEPARTMENT_OTHER): Payer: BC Managed Care – PPO

## 2015-02-11 ENCOUNTER — Observation Stay (HOSPITAL_COMMUNITY): Payer: BC Managed Care – PPO

## 2015-02-11 DIAGNOSIS — K219 Gastro-esophageal reflux disease without esophagitis: Secondary | ICD-10-CM | POA: Diagnosis not present

## 2015-02-11 DIAGNOSIS — R509 Fever, unspecified: Secondary | ICD-10-CM

## 2015-02-11 DIAGNOSIS — E118 Type 2 diabetes mellitus with unspecified complications: Secondary | ICD-10-CM

## 2015-02-11 DIAGNOSIS — R0602 Shortness of breath: Secondary | ICD-10-CM | POA: Diagnosis not present

## 2015-02-11 DIAGNOSIS — E119 Type 2 diabetes mellitus without complications: Secondary | ICD-10-CM | POA: Diagnosis not present

## 2015-02-11 DIAGNOSIS — Z720 Tobacco use: Secondary | ICD-10-CM

## 2015-02-11 DIAGNOSIS — R0789 Other chest pain: Secondary | ICD-10-CM | POA: Diagnosis not present

## 2015-02-11 DIAGNOSIS — I10 Essential (primary) hypertension: Secondary | ICD-10-CM | POA: Diagnosis not present

## 2015-02-11 DIAGNOSIS — R079 Chest pain, unspecified: Secondary | ICD-10-CM | POA: Diagnosis not present

## 2015-02-11 LAB — HEMOGLOBIN A1C
Hgb A1c MFr Bld: 6.9 % — ABNORMAL HIGH (ref 4.8–5.6)
Mean Plasma Glucose: 151 mg/dL

## 2015-02-11 LAB — CBC
HCT: 38.3 % — ABNORMAL LOW (ref 39.0–52.0)
Hemoglobin: 13.1 g/dL (ref 13.0–17.0)
MCH: 30.3 pg (ref 26.0–34.0)
MCHC: 34.2 g/dL (ref 30.0–36.0)
MCV: 88.7 fL (ref 78.0–100.0)
PLATELETS: 154 10*3/uL (ref 150–400)
RBC: 4.32 MIL/uL (ref 4.22–5.81)
RDW: 12.8 % (ref 11.5–15.5)
WBC: 7.1 10*3/uL (ref 4.0–10.5)

## 2015-02-11 LAB — HEPARIN LEVEL (UNFRACTIONATED)
Heparin Unfractionated: 0.26 IU/mL — ABNORMAL LOW (ref 0.30–0.70)
Heparin Unfractionated: 0.44 IU/mL (ref 0.30–0.70)
Heparin Unfractionated: 0.32 IU/mL (ref 0.30–0.70)

## 2015-02-11 LAB — GLUCOSE, CAPILLARY
GLUCOSE-CAPILLARY: 128 mg/dL — AB (ref 65–99)
Glucose-Capillary: 147 mg/dL — ABNORMAL HIGH (ref 65–99)
Glucose-Capillary: 159 mg/dL — ABNORMAL HIGH (ref 65–99)
Glucose-Capillary: 167 mg/dL — ABNORMAL HIGH (ref 65–99)

## 2015-02-11 MED ORDER — TECHNETIUM TC 99M SESTAMIBI GENERIC - CARDIOLITE
30.0000 | Freq: Once | INTRAVENOUS | Status: AC | PRN
  Administered 2015-02-11: 30 via INTRAVENOUS

## 2015-02-11 MED ORDER — HEPARIN SODIUM (PORCINE) 5000 UNIT/ML IJ SOLN
5000.0000 [IU] | Freq: Three times a day (TID) | INTRAMUSCULAR | Status: DC
  Administered 2015-02-11 – 2015-02-12 (×2): 5000 [IU] via SUBCUTANEOUS
  Filled 2015-02-11 (×2): qty 1

## 2015-02-11 MED ORDER — IOHEXOL 350 MG/ML SOLN
100.0000 mL | Freq: Once | INTRAVENOUS | Status: AC | PRN
Start: 2015-02-11 — End: 2015-02-11
  Administered 2015-02-11: 100 mL via INTRAVENOUS

## 2015-02-11 MED ORDER — REGADENOSON 0.4 MG/5ML IV SOLN
0.4000 mg | Freq: Once | INTRAVENOUS | Status: AC
  Administered 2015-02-11: 0.4 mg via INTRAVENOUS
  Filled 2015-02-11: qty 5

## 2015-02-11 MED ORDER — REGADENOSON 0.4 MG/5ML IV SOLN
INTRAVENOUS | Status: AC
  Administered 2015-02-11: 0.4 mg via INTRAVENOUS
  Filled 2015-02-11: qty 5

## 2015-02-11 MED ORDER — TECHNETIUM TC 99M SESTAMIBI GENERIC - CARDIOLITE
10.0000 | Freq: Once | INTRAVENOUS | Status: AC | PRN
  Administered 2015-02-11: 10 via INTRAVENOUS

## 2015-02-11 NOTE — Progress Notes (Signed)
[facility] PROGRESS NOTE Assessment/Plan: Chest pain radiating to arm: - Troponins negative 3, EKG showed no ischemic changes. - Echo result is pending. He has multiple risk factors he's got better, with history of hypertension, hyperlipidemia and an ongoing smoker. - Appriciate cardiology's assistance. - He is currently on aspirin, heparin, statin, beta blocker and nitroglycerin when necessary.  Fever in adult: He was given Tylenol and defervesced, he has no leukocytosis, his chest x-ray showed no infiltrates. UA does not show any nitrates or white blood cells blood cultures are pending.  D/dimer on admission was negative and he is low risk for VTE, which rules out DVT/PE. Cont to monitor fever curve.  Tobacco abuse-smokes and chews  Type II diabetes mellitus - Start him on a heart healthy diet, nothing by mouth after midnight for possible stress test. - Continue to hold his metformin, start sliding scale insulin.    GERD (gastroesophageal reflux disease)  Hyperlipidemia: Statins  Essential  Hypertension: - Seems to be stable continue current regimen.  Code Status: full Family Communication: none  Disposition Plan: home in 1-2 days   Consultants:  Cardiology  Procedures:  Echo [id number]: Systolic function was vigorous. The estimated ejection fraction was in the range of 65% to 70%. Wall motion was normal; there were no regional wall motion abnormalities. Features are consistent with a pseudonormal left ventricular filling pattern, with concomitant abnormal relaxation and increased filling pressure (grade 2 diastolic  ysfunction). Doppler parameters are consistent with elevated ventricular end-diastolic filling pressure.  Antibiotics:  None  HPI/Subjective: Relates chest pain-free no shortness of breath.  Objective: Filed Vitals:   02/10/15 1735 02/10/15 1757 02/10/15 2053 02/11/15 0541  BP: 90/51 107/48 109/48 122/65  Pulse:  90 73 68  Temp:  100.9 F (38.3 C) 100.2 F (37.9 C) 99.2 F (37.3 C) 98.3 F (36.8 C)  TempSrc:  Oral Oral Oral  Resp:  22    Height:      Weight:    103.3 kg (227 lb 11.8 oz)  SpO2:  93% 98% 98%   No intake or output data in the 24 hours ending 02/11/15 0813 Filed Weights   02/09/15 2047 02/10/15 0011 02/11/15 0541  Weight: 102.059 kg (225 lb) 103.193 kg (227 lb 8 oz) 103.3 kg (227 lb 11.8 oz)    Exam:  General: Alert, awake, oriented x3, in no acute distress.  HEENT: No bruits, no goiter.  Heart: Regular rate and rhythm. Lungs: Good air movement, Clear Abdomen: Soft, nontender, nondistended, positive bowel sounds.  Neuro: Grossly intact, nonfocal.   Data Reviewed: Basic Metabolic Panel:  Recent Labs Lab 02/09/15 2050  NA 136  K 3.6  CL 100*  CO2 26  GLUCOSE 189*  BUN 11  CREATININE 0.83  CALCIUM 8.9   Liver Function Tests: No results for input(s): AST, ALT, ALKPHOS, BILITOT, PROT, ALBUMIN in the last 168 hours. No results for input(s): LIPASE, AMYLASE in the last 168 hours. No results for input(s): AMMONIA in the last 168 hours. CBC:  Recent Labs Lab 02/09/15 2050 02/10/15 1215 02/11/15 0312  WBC 9.6 8.0 7.1  HGB 15.7 14.0 13.1  HCT 44.5 40.5 38.3*  MCV 87.8 88.8 88.7  PLT 208 173 154   Cardiac Enzymes:  Recent Labs Lab 02/09/15 2358 02/10/15 1215 02/10/15 1739  TROPONINI <0.03 <0.03 <0.03   BNP (last 3 results) No results for input(s): BNP in the last 8760 hours.  ProBNP (last 3 results) No results for input(s): PROBNP in the last 8760  hours.  CBG:  Recent Labs Lab 02/10/15 0730 02/10/15 1114 02/10/15 1631 02/10/15 2055 02/11/15 0739  GLUCAP 116* 151* 116* 157* 128*    No results found for this or any previous visit (from the past 240 hour(s)).   Studies: Dg Chest 2 View  02/10/2015   CLINICAL DATA:  59 year old male with fever  EXAM: CHEST  2 VIEW  COMPARISON:  Radiograph dated 02/09/2015  FINDINGS: Two views of the chest demonstrate  minimal bibasilar linear and platelike atelectasis. A 1 cm nodular appearing density in the right lower lung field seen on the frontal projection may represent a vascular confluence or an area of atelectasis. No pulmonary nodule is not excluded. Follow-up recommended. There is no focal consolidation, pleural effusion, or pneumothorax. The cardiomediastinal silhouette is within normal limits. The osseous structures appear unremarkable.  IMPRESSION: No active cardiopulmonary disease.   Electronically Signed   By: Anner Crete M.D.   On: 02/10/2015 18:29   Dg Chest 2 View  02/09/2015   CLINICAL DATA:  Chest pain and shortness of breath for 1 day  EXAM: CHEST - 2 VIEW  COMPARISON:  01/24/2012  FINDINGS: Cardiac shadow is within normal limits. The lungs are well aerated bilaterally. Minimal left basilar atelectasis is seen. No sizable effusion is noted. No bony abnormality is noted.  IMPRESSION: Minimal left basilar atelectasis.   Electronically Signed   By: Inez Catalina M.D.   On: 02/09/2015 21:26    Scheduled Meds: . amLODipine  5 mg Oral Daily  . aspirin EC  325 mg Oral Daily  . carvedilol  12.5 mg Oral BID WC  . insulin aspart  0-5 Units Subcutaneous QHS  . insulin aspart  0-9 Units Subcutaneous TID WC  . insulin glargine  55 Units Subcutaneous QHS  . lisinopril  20 mg Oral Daily  . pantoprazole  40 mg Oral Daily  . rosuvastatin  5 mg Oral Daily  . sodium chloride  3 mL Intravenous Q12H   Continuous Infusions: . heparin 1,900 Units/hr (02/11/15 1594)    Time Spent: 25 min   Charlynne Cousins  [facility] Pager [phone]. If 7PM-7AM, please contact night-coverage at [email or web address], password [facility] 02/11/2015, 8:13 AM

## 2015-02-11 NOTE — Progress Notes (Signed)
Reviewed stress with Dr. Debara Pickett - he reviewed nuc and echo and ok for discharge.  No further cardiac work up necessary. Pt notified.

## 2015-02-11 NOTE — Progress Notes (Signed)
ANTICOAGULATION CONSULT NOTE - Follow Up Consult  Pharmacy Consult for Heparin  Indication: chest pain/ACS  Allergies  Allergen Reactions  . Sulfa Antibiotics Rash   Patient Measurements: Height: 5\' 11"  (180.3 cm) Weight: 227 lb 11.8 oz (103.3 kg) IBW/kg (Calculated) : 75.3  Vital Signs: Temp: 98.3 F (36.8 C) (07/22 0541) Temp Source: Oral (07/22 0541) BP: 148/78 mmHg (07/22 1024) Pulse Rate: 68 (07/22 0541)  Labs:  Recent Labs  02/09/15 2050 02/09/15 2358  02/10/15 1215 02/10/15 1739 02/11/15 0312 02/11/15 1150  HGB 15.7  --   --  14.0  --  13.1  --   HCT 44.5  --   --  40.5  --  38.3*  --   PLT 208  --   --  173  --  154  --   HEPARINUNFRC  --   --   < >  --  0.14* 0.26* 0.44  CREATININE 0.83  --   --   --   --   --   --   TROPONINI  --  <0.03  --  <0.03 <0.03  --   --   < > = values in this interval not displayed.  Estimated Creatinine Clearance: 114.3 mL/min (by C-G formula based on Cr of 0.83).  Assessment: 47 yom continues on IV heparin for [place]. Heparin level is now therapeutic at 0.44. CBC is stable and no bleeding noted.   Goal of Therapy:  Heparin level 0.3-0.7 units/ml Monitor platelets by anticoagulation protocol: Yes   Plan:  - Continue heparin gtt at 1900 units/hr - Check a 6 hour heparin level to confirm dosing - Continue daily HL and CBC  Salome Arnt, PharmD, BCPS Pager # [phone] 02/11/2015 3:00 PM

## 2015-02-11 NOTE — Progress Notes (Signed)
Nuclear images reviewed.  There is an issue with Epic and cannot put formal report in at this time.  Spect images showed a moderate sized, moderate intensity partially fixed defect in the inferolateral wall that is most consistent with variations in diaphragmatic attenuation.  Unlikely to represent significant ischemia.  Low normal LVF with EF 50%.

## 2015-02-11 NOTE — Progress Notes (Signed)
Subjective: No pain last pm after in bed.  None now.  Objective: Vital signs in last 24 hours: Temp:  [98.3 F (36.8 C)-101.1 F (38.4 C)] 98.3 F (36.8 C) (07/22 0541) Pulse Rate:  [68-90] 68 (07/22 0541) Resp:  [22] 22 (07/21 1757) BP: (90-140)/(48-78) 140/78 mmHg (07/22 0915) SpO2:  [93 %-98 %] 98 % (07/22 0541) Weight:  [227 lb 11.8 oz (103.3 kg)] 227 lb 11.8 oz (103.3 kg) (07/22 0541) Weight change: 2 lb 11.8 oz (1.241 kg)   Intake/Output from previous day:   Intake/Output this shift:    PE: General:Pleasant affect, NAD Skin:Warm and dry, brisk capillary refill HEENT:normocephalic, sclera clear, mucus membranes moist Heart:S1S2 RRR without murmur, gallup, rub or click Lungs:clear without rales, rhonchi, or wheezes WSF:KCLE, non tender, + BS, do not palpate liver spleen or masses Ext:no lower ext edema, 2+ pedal pulses, 2+ radial pulses Neuro:alert and oriented, MAE, follows commands, + facial symmetry  tele:  SR  Lab Results:  Recent Labs  02/10/15 1215 02/11/15 0312  WBC 8.0 7.1  HGB 14.0 13.1  HCT 40.5 38.3*  PLT 173 154   BMET  Recent Labs  02/09/15 2050  NA 136  K 3.6  CL 100*  CO2 26  GLUCOSE 189*  BUN 11  CREATININE 0.83  CALCIUM 8.9    Recent Labs  02/10/15 1215 02/10/15 1739  TROPONINI <0.03 <0.03    Lab Results  Component Value Date   CHOL 163 01/25/2012   HDL 29* 01/25/2012   LDLCALC 78 01/25/2012   TRIG 278* 01/25/2012   CHOLHDL 5.6 01/25/2012   Lab Results  Component Value Date   HGBA1C 6.9* 02/10/2015     Lab Results  Component Value Date   TSH 1.767 02/10/2015     Studies/Results: Dg Chest 2 View  02/10/2015   CLINICAL DATA:  59 year old male with fever  EXAM: CHEST  2 VIEW  COMPARISON:  Radiograph dated 02/09/2015  FINDINGS: Two views of the chest demonstrate minimal bibasilar linear and platelike atelectasis. A 1 cm nodular appearing density in the right lower lung field seen on the frontal  projection may represent a vascular confluence or an area of atelectasis. No pulmonary nodule is not excluded. Follow-up recommended. There is no focal consolidation, pleural effusion, or pneumothorax. The cardiomediastinal silhouette is within normal limits. The osseous structures appear unremarkable.  IMPRESSION: No active cardiopulmonary disease.   Electronically Signed   By: Anner Crete M.D.   On: 02/10/2015 18:29   Dg Chest 2 View  02/09/2015   CLINICAL DATA:  Chest pain and shortness of breath for 1 day  EXAM: CHEST - 2 VIEW  COMPARISON:  01/24/2012  FINDINGS: Cardiac shadow is within normal limits. The lungs are well aerated bilaterally. Minimal left basilar atelectasis is seen. No sizable effusion is noted. No bony abnormality is noted.  IMPRESSION: Minimal left basilar atelectasis.   Electronically Signed   By: Inez Catalina M.D.   On: 02/09/2015 21:26    Medications: I have reviewed the patient's current medications. Scheduled Meds: . amLODipine  5 mg Oral Daily  . aspirin EC  325 mg Oral Daily  . carvedilol  12.5 mg Oral BID WC  . insulin aspart  0-5 Units Subcutaneous QHS  . insulin aspart  0-9 Units Subcutaneous TID WC  . insulin glargine  55 Units Subcutaneous QHS  . lisinopril  20 mg Oral Daily  . pantoprazole  40 mg Oral Daily  .  regadenoson      . regadenoson  0.4 mg Intravenous Once  . rosuvastatin  5 mg Oral Daily  . sodium chloride  3 mL Intravenous Q12H   Continuous Infusions: . heparin 1,900 Units/hr (02/11/15 0605)   PRN Meds:.acetaminophen **OR** acetaminophen, albuterol, alum & mag hydroxide-simeth, morphine injection, nitroGLYCERIN, ondansetron **OR** ondansetron (ZOFRAN) IV  Assessment/Plan: Principal Problem:   Chest pain Active Problems:   GERD (gastroesophageal reflux disease)   Hyperlipidemia   Hypertension   Type II diabetes mellitus   Tobacco abuse-smokes and chews   Chest pain radiating to arm  Pt stable and completed Lexiscan myoview.     Time spent with pt. : 15 minutes. [facility] R  Nurse Practitioner Certified Pager [phone] or after 5pm and on weekends call [phone] 02/11/2015, 10:11 AM

## 2015-02-11 NOTE — Progress Notes (Signed)
*  Preliminary Results* Bilateral lower extremity venous duplex completed. Bilateral lower extremities are negative for deep vein thrombosis. There is no evidence of Baker's cyst bilaterally.  02/11/2015  Maudry Mayhew, RVT, RDCS, RDMS

## 2015-02-11 NOTE — Progress Notes (Signed)
Heparin stopped with neg CTA for PE and no DVT on venous doppler.

## 2015-02-11 NOTE — Progress Notes (Signed)
ANTICOAGULATION CONSULT NOTE - Follow Up Consult  Pharmacy Consult for Heparin  Indication: chest pain/ACS  Allergies  Allergen Reactions  . Sulfa Antibiotics Rash    Patient Measurements: Height: 5\' 11"  (180.3 cm) Weight: 227 lb 8 oz (103.193 kg) IBW/kg (Calculated) : 75.3  Vital Signs: Temp: 99.2 F (37.3 C) (07/21 2053) Temp Source: Oral (07/21 2053) BP: 109/48 mmHg (07/21 2053) Pulse Rate: 73 (07/21 2053)  Labs:  Recent Labs  02/09/15 2050 02/09/15 2358 02/10/15 0755 02/10/15 1215 02/10/15 1739 02/11/15 0312  HGB 15.7  --   --  14.0  --  13.1  HCT 44.5  --   --  40.5  --  38.3*  PLT 208  --   --  173  --  154  HEPARINUNFRC  --   --  0.23*  --  0.14* 0.26*  CREATININE 0.83  --   --   --   --   --   TROPONINI  --  <0.03  --  <0.03 <0.03  --     Estimated Creatinine Clearance: 114.3 mL/min (by C-G formula based on Cr of 0.83).  Assessment: Sub-therapeutic heparin level, no issues per RN.   Goal of Therapy:  Heparin level 0.3-0.7 units/ml Monitor platelets by anticoagulation protocol: Yes   Plan:  -Increase heparin to 1900 units/hr -1200 HL -Daily CBC/HL -Monitor for bleeding  Narda Bonds 02/11/2015,4:34 AM

## 2015-02-12 DIAGNOSIS — R509 Fever, unspecified: Secondary | ICD-10-CM | POA: Diagnosis not present

## 2015-02-12 DIAGNOSIS — I5022 Chronic systolic (congestive) heart failure: Secondary | ICD-10-CM

## 2015-02-12 DIAGNOSIS — R0789 Other chest pain: Secondary | ICD-10-CM | POA: Diagnosis not present

## 2015-02-12 DIAGNOSIS — E118 Type 2 diabetes mellitus with unspecified complications: Secondary | ICD-10-CM | POA: Diagnosis not present

## 2015-02-12 DIAGNOSIS — I10 Essential (primary) hypertension: Secondary | ICD-10-CM | POA: Diagnosis not present

## 2015-02-12 LAB — CBC
HEMATOCRIT: 39.3 % (ref 39.0–52.0)
HEMOGLOBIN: 13.6 g/dL (ref 13.0–17.0)
MCH: 30.6 pg (ref 26.0–34.0)
MCHC: 34.6 g/dL (ref 30.0–36.0)
MCV: 88.3 fL (ref 78.0–100.0)
Platelets: 170 10*3/uL (ref 150–400)
RBC: 4.45 MIL/uL (ref 4.22–5.81)
RDW: 12.8 % (ref 11.5–15.5)
WBC: 7.1 10*3/uL (ref 4.0–10.5)

## 2015-02-12 LAB — GLUCOSE, CAPILLARY: GLUCOSE-CAPILLARY: 110 mg/dL — AB (ref 65–99)

## 2015-02-12 NOTE — Discharge Summary (Signed)
Physician Discharge Summary  Gregory Fox JJK:[id number] DOB: 08-24-1955 DOA: 02/09/2015  PCP: [name], MD  Admit date: 02/09/2015 Discharge date: 02/12/2015  Time spent: 25 minutes  Recommendations for Outpatient Follow-up:  1. Follow up with cardiology as an outpatient 2. Will need a sleep study as an outpatient  Discharge Diagnoses:  Principal Problem:   Chest pain Active Problems:   GERD (gastroesophageal reflux disease)   Hyperlipidemia   Hypertension   Type II diabetes mellitus   Tobacco abuse-smokes and chews   Chest pain radiating to arm   Pain in the chest   Fever   Type 2 diabetes mellitus with complication   Discharge Condition: stable  Diet recommendation: low sodium  Filed Weights   02/10/15 0011 02/11/15 0541 02/12/15 0535  Weight: 103.193 kg (227 lb 8 oz) 103.3 kg (227 lb 11.8 oz) 101.197 kg (223 lb 1.6 oz)    History of present illness:  59 y.o. male with history of HTN, HLD, DM 2/IDDM, GERD, ongoing tobacco abuse-smokes and chews, normal Myoview July 2013, presented to [facility] ED on 02/09/15 with precordial chest pain radiating to left arm. Patient drives a lot for his work. He returned home at approximately 6 PM tonight and had subacute onset of precordial pressure/dull pain rated at 7/10 in severity, radiating to left upper extremity, associated with mild dyspnea but no nausea or diaphoresis. This pain was unlike his GERD pain. He proceeded to drink water and eat dinner without relief. At times chest pain seems worse with deep inspiration. No cough reported. No history of lifting or moving any heavy objects.   Hospital Course:  Chest pain radiating to arm: - Troponins negative 3, EKG showed no ischemic changes. - Echo result as below. He has multiple risk factors he's got better, with history of hypertension, hyperlipidemia and an ongoing smoker. - Cardiology consulted rec stress test that showed evidence of reversible ischemia. - COnt aspirin,  heparin, statin, beta blocker and nitroglycerin when necessary. - Ct angio of chest and lower ext doppler negative for VTE.  Fever in adult: He was given Tylenol and defervesced, he has no leukocytosis, his chest x-ray showed no infiltrates. UA does not show any nitrates or white blood cells blood cultures are pending.  Ct angio of chest and lower ext doppler negative for VTE.  Tobacco abuse-smokes and chews  Type II diabetes mellitus - No changes made, resume home medications.   GERD (gastroesophageal reflux disease)  Hyperlipidemia: Statins  Essential Hypertension: - Seems to be stable continue current regimen.   Procedures:  Echo 7.20.2016: estimated ejection fraction was in the range of 65% to 70%. Wall motion was normal; there were no regional wall motion abnormalities. Features are consistent with a pseudonormal left ventricular filling pattern, with concomitant abnormal relaxation and increased filling pressure (grade 2 diastolic dysfunction). Doppler parameters are consistent with elevated ventricular end-diastolic filling pressure  Consultations:  Cardiology  Discharge Exam: Filed Vitals:   02/12/15 0535  BP: 105/60  Pulse: 68  Temp: 98.8 F (37.1 C)  Resp: 18    General: A&O x3 Cardiovascular: RRR Respiratory: good air movement CTA B/L  Discharge Instructions   Discharge Instructions    Diet - low sodium heart healthy    Complete by:  As directed      Increase activity slowly    Complete by:  As directed           Current Discharge Medication List    CONTINUE these medications which have NOT  CHANGED   Details  amLODipine (NORVASC) 5 MG tablet Take 5 mg by mouth daily. for high blood pressure Refills: 1    aspirin EC 81 MG tablet Take 81 mg by mouth daily.    carvedilol (COREG) 12.5 MG tablet Take 12.5 mg by mouth 2 (two) times daily. Refills: 0    esomeprazole (NEXIUM) 20 MG capsule Take 20 mg by mouth daily at 12 noon.     LANTUS SOLOSTAR 100 UNIT/ML Solostar Pen Inject 63 Units into the skin at bedtime. Refills: 5    lisinopril (PRINIVIL,ZESTRIL) 20 MG tablet Take 20 mg by mouth daily.    rosuvastatin (CRESTOR) 5 MG tablet Take 5 mg by mouth daily. Refills: 1    TANZEUM 30 MG PEN Inject 30 mg into the skin once a week. Takes on sundays.       Allergies  Allergen Reactions  . Sulfa Antibiotics Rash   Follow-up Information    Follow up with [facility] In 1 week.   Specialty:  Cardiology   Why:  hospital follow up   Contact information:   [id number] [phone]       The results of significant diagnostics from this hospitalization (including imaging, microbiology, ancillary and laboratory) are listed below for reference.    Significant Diagnostic Studies: Dg Chest 2 View  02/10/2015   CLINICAL DATA:  59 year old male with fever  EXAM: CHEST  2 VIEW  COMPARISON:  Radiograph dated 02/09/2015  FINDINGS: Two views of the chest demonstrate minimal bibasilar linear and platelike atelectasis. A 1 cm nodular appearing density in the right lower lung field seen on the frontal projection may represent a vascular confluence or an area of atelectasis. No pulmonary nodule is not excluded. Follow-up recommended. There is no focal consolidation, pleural effusion, or pneumothorax. The cardiomediastinal silhouette is within normal limits. The osseous structures appear unremarkable.  IMPRESSION: No active cardiopulmonary disease.   Electronically Signed   By: Anner Crete M.D.   On: 02/10/2015 18:29   Dg Chest 2 View  02/09/2015   CLINICAL DATA:  Chest pain and shortness of breath for 1 day  EXAM: CHEST - 2 VIEW  COMPARISON:  01/24/2012  FINDINGS: Cardiac shadow is within normal limits. The lungs are well aerated bilaterally. Minimal left basilar atelectasis is seen. No sizable effusion is noted. No bony  abnormality is noted.  IMPRESSION: Minimal left basilar atelectasis.   Electronically Signed   By: Inez Catalina M.D.   On: 02/09/2015 21:26   Ct Angio Chest Pe W/cm &/or Wo Cm  02/11/2015   CLINICAL DATA:  59 year old male with chest pain.  EXAM: CT ANGIOGRAPHY CHEST WITH CONTRAST  TECHNIQUE: Multidetector CT imaging of the chest was performed using the standard protocol during bolus administration of intravenous contrast. Multiplanar CT image reconstructions and MIPs were obtained to evaluate the vascular anatomy.  CONTRAST:  123mL OMNIPAQUE IOHEXOL 350 MG/ML SOLN  COMPARISON:  Chest radiograph dated 02/10/2015  FINDINGS: Bibasilar linear atelectasis/ scarring. There is no focal consolidation or pneumothorax. Trace bilateral pleural effusions noted. The central airways are patent.  The visualized thoracic aorta appears unremarkable. Stop the R caps stop no CT evidence of pulmonary embolism. There is no pleural or mediastinal adenopathy. No pericardial effusion or cardiomegaly. Dense coronary vascular calcifications noted. The esophagus is collapsed. The thyroid gland appears unremarkable. The chest wall is unremarkable. Next there is mild degenerative changes of the spine. No  acute fracture. The visualized upper abdomen appears unremarkable.  Review of the MIP images confirms the above findings.  IMPRESSION: No CT evidence of pulmonary embolism.  Trace bilateral pleural effusions with minimal bibasilar atelectatic changes. No focal consolidation.   Electronically Signed   By: Anner Crete M.D.   On: 02/11/2015 18:53    Microbiology: Recent Results (from the past 240 hour(s))  Culture, blood (routine x 2)     Status: None (Preliminary result)   Collection Time: 02/10/15  8:06 PM  Result Value Ref Range Status   Specimen Description BLOOD LEFT WRIST  Final   Special Requests BOTTLES DRAWN AEROBIC AND ANAEROBIC 10CC  Final   Culture NO GROWTH < 24 HOURS  Final   Report Status PENDING  Incomplete   Culture, blood (routine x 2)     Status: None (Preliminary result)   Collection Time: 02/10/15  8:12 PM  Result Value Ref Range Status   Specimen Description BLOOD LEFT HAND  Final   Special Requests BOTTLES DRAWN AEROBIC AND ANAEROBIC 5CC EA  Final   Culture NO GROWTH < 24 HOURS  Final   Report Status PENDING  Incomplete     Labs: Basic Metabolic Panel:  Recent Labs Lab 02/09/15 2050  NA 136  K 3.6  CL 100*  CO2 26  GLUCOSE 189*  BUN 11  CREATININE 0.83  CALCIUM 8.9   Liver Function Tests: No results for input(s): AST, ALT, ALKPHOS, BILITOT, PROT, ALBUMIN in the last 168 hours. No results for input(s): LIPASE, AMYLASE in the last 168 hours. No results for input(s): AMMONIA in the last 168 hours. CBC:  Recent Labs Lab 02/09/15 2050 02/10/15 1215 02/11/15 0312 02/12/15 0440  WBC 9.6 8.0 7.1 7.1  HGB 15.7 14.0 13.1 13.6  HCT 44.5 40.5 38.3* 39.3  MCV 87.8 88.8 88.7 88.3  PLT 208 173 154 170   Cardiac Enzymes:  Recent Labs Lab 02/09/15 2358 02/10/15 1215 02/10/15 1739  TROPONINI <0.03 <0.03 <0.03   BNP: BNP (last 3 results) No results for input(s): BNP in the last 8760 hours.  ProBNP (last 3 results) No results for input(s): PROBNP in the last 8760 hours.  CBG:  Recent Labs Lab 02/11/15 0739 02/11/15 1154 02/11/15 1706 02/11/15 2106 02/12/15 0737  GLUCAP 128* 147* 159* 167* 110*       Signed:  Charlynne Cousins  [facility] 02/12/2015, 8:07 AM

## 2015-02-15 LAB — CULTURE, BLOOD (ROUTINE X 2)
CULTURE: NO GROWTH
Culture: NO GROWTH

## 2015-02-20 NOTE — Progress Notes (Addendum)
Patient ID: Gregory Fox, male   DOB: 08/03/1955, 59 y.o.   MRN: [id number]     Cardiology Office Note   Date:  02/20/2015   ID:  Gregory Fox, Gregory Fox 11/09/1955, MRN [id number]  PCP:  Gregory Sake, MD  Cardiologist:   Gregory Rouge, MD   No chief complaint on file.     History of Present Illness: Gregory Fox is a 59 y.o. male who presents for evaluation by primary last seen in 2013 for palpitations and atypical chest pain with negative w/u  01/31/12 Overall Impression: Probable normal perfusion and soft tissue attenuation (diaphragm). Overall low risk scan.  LV Ejection Fraction: 61%. LV Wall Motion: NL LV Function; NL Wall Motion  Echo done 01/25/12 reviewed and normal with no valve disease and EF 60%  Both studies reviewed.  CRF;s include HTN, elevated lipids and Type 2 DM.  Has been on beta blocker for HTN and palpitations in past   Reviewed primary records. Gregory Fox   Recent LDL 84 on statin. He has had angioedema with lisinopril before Last A1c 7.2    Recently hospitalized for SSCP.  D/C on 7/23.  Seen by Dr Gregory Fox.  Extensive w/u with normal myovue, negative CTA for PE  R/O and no acute ECG changes  Did have low grade fever while  In hospital. Described precordial pressure/dull pain rated at 7/10 in severity, radiating to left upper extremity, associated with mild dyspnea but no nausea or diaphoresis. This pain was unlike his GERD pain. He proceeded to drink water and eat dinner without relief. At times chest pain seems worse with deep inspiration. No cough reported. No history of lifting or moving any heavy objects. Subsequently pain got worse to 9/10 in severity. He thereby presented to the ED. No complaints of asymmetrical leg pain or swelling. In the ED, sublingual nitroglycerin in did not provide relief and neither did a dose of IV morphine 4 mg 1. Subsequently received IV Dilaudid 1 MG 1 and pain subsided to 5/10 in severity  Since d/c has been ok.  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  Smokes 3 cigs/day and chews [place] all day.  BS poorly controlled and meds adjusted by Dr Gregory Fox recently   Has trip to [place] planned soon   Reviewed Epic notes from recent records time alone 30 minutes   Past Medical History  Diagnosis Date  . Hypertension   . Hypercholesteremia   . Type II diabetes mellitus   . Heart murmur   . GERD (gastroesophageal reflux disease)   . Gout   . Taste absent 01/24/12    "had wisdom tooth pulled; couple days later 2 crowns put in; can't taste since"    Past Surgical History  Procedure Laterality Date  . No past surgeries       Current Outpatient Prescriptions  Medication Sig Dispense Refill  . amLODipine (NORVASC) 5 MG tablet Take 5 mg by mouth daily. for high blood pressure  1  . aspirin EC 81 MG tablet Take 81 mg by mouth daily.    . carvedilol (COREG) 12.5 MG tablet Take 12.5 mg by mouth 2 (two) times daily.  0  . esomeprazole (NEXIUM) 20 MG capsule Take 20 mg by mouth daily at 12 noon.    Marland Kitchen LANTUS SOLOSTAR 100 UNIT/ML Solostar Pen Inject 63 Units into the skin at bedtime.  5  . lisinopril (PRINIVIL,ZESTRIL) 20 MG tablet Take 20 mg by mouth daily.    Marland Kitchen  rosuvastatin (CRESTOR) 5 MG tablet Take 5 mg by mouth daily.  1  . TANZEUM 30 MG PEN Inject 30 mg into the skin once a week. Takes on sundays.     No current facility-administered medications for this visit.    Allergies:   Sulfa antibiotics    Social History:  The patient  reports that he has been smoking.  He uses smokeless tobacco. He reports that he drinks about 4.2 oz of alcohol per week. He reports that he does not use illicit drugs.   Family History:  The patient's family history includes CAD in his father.    ROS:  Please see the history of present illness.   Otherwise, review of systems are positive for none.   All other systems are reviewed and negative.    PHYSICAL EXAM: VS:  There were no vitals taken for this visit. ,  BMI There is no weight on file to calculate BMI. Affect appropriate Healthy:  appears stated age 39: normal Neck supple with no adenopathy JVP normal no bruits no thyromegaly Lungs clear with no wheezing and good diaphragmatic motion Heart:  S1/S2 no murmur, no rub, gallop or click PMI normal Abdomen: benighn, BS positve, no tenderness, no AAA no bruit.  No HSM or HJR Distal pulses intact with no bruits No edema Neuro non-focal Skin warm and dry No muscular weakness    EKG:  02/13/15  SR rate 71 normal ECG    Recent Labs: 02/09/2015: BUN 11; Creatinine, Ser 0.83; Potassium 3.6; Sodium 136 02/10/2015: TSH 1.767 02/12/2015: Hemoglobin 13.6; Platelets 170    Lipid Panel    Component Value Date/Time   CHOL 163 01/25/2012 0620   TRIG 278* 01/25/2012 0620   HDL 29* 01/25/2012 0620   CHOLHDL 5.6 01/25/2012 0620   VLDL 56* 01/25/2012 0620   LDLCALC 78 01/25/2012 0620      Wt Readings from Last 3 Encounters:  02/12/15 101.197 kg (223 lb 1.6 oz)  02/21/12 99.791 kg (220 lb)  01/31/12 97.07 kg (214 lb)      Other studies Reviewed: Additional studies/ records that were reviewed today include: Epic notes recent hospitalization and notes primary Dr Gregory Fox.    ASSESSMENT AND PLAN:  1.  Chest Pain:  Myovue images wit some inferior attenuation.  Multiple poorly controlled risk factors  Reassuring that ECG/enzymes normal.  Pain also did not respond To nitro or high doses of narcotics.  Do not think cath indicated.  Will get calcium score to make sure it is not very high.  If his calcium score is extremely high with his risk  Factor profile would be more inclined to cath him given the severity of his symptoms and risk profile.  Continue ASA and beta blocker nitro called in   2. Smoking counseled for less than 10 minutes given snuff use suggested nicotine replacement    3. DM:  Discussed low carb diet Travel history makes good diet difficult  Lantus dose recently adjusted     4. HTN:  Well controlled.  Continue current medications and low sodium Dash type diet.    5. Chol:  Continue crestor labs with primary    Current medicines are reviewed at length with the patient today.  The patient does not have concerns regarding medicines.  The following changes have been made:  no change  Labs/ tests ordered today include:  Calcium Score   No orders of the defined types were placed in this encounter.     Addendum:  Calcium score was extremely elevated at 1569 including dense calcification of the entire LAD.  96th percentile for age and sex matched controls Given 3 vessel nature of calcium patient could have balanced ischemia.  Left message for him and would consider proceeding with heart cath this week to  Further risk stratify.    Gregory Fox   Disposition:   FU with me in 6 months      Signed, Gregory Rouge, MD  02/20/2015 2:06 PM    [facility] [address], [place], [place]  [address] Phone: [phone]; Fax: [phone]

## 2015-02-22 ENCOUNTER — Ambulatory Visit (INDEPENDENT_AMBULATORY_CARE_PROVIDER_SITE_OTHER)
Admission: RE | Admit: 2015-02-22 | Discharge: 2015-02-22 | Disposition: A | Discharge status: Home / Self Care | Payer: Self-pay | Source: Ambulatory Visit | Attending: Cardiovascular Disease | Admitting: Cardiovascular Disease

## 2015-02-22 ENCOUNTER — Ambulatory Visit (INDEPENDENT_AMBULATORY_CARE_PROVIDER_SITE_OTHER): Payer: BC Managed Care – PPO | Admitting: Cardiovascular Disease

## 2015-02-22 ENCOUNTER — Encounter: Payer: Self-pay | Admitting: Cardiovascular Disease

## 2015-02-22 VITALS — BP 112/60 | HR 80 | Ht 71.0 in | Wt 223.8 lb

## 2015-02-22 DIAGNOSIS — R079 Chest pain, unspecified: Secondary | ICD-10-CM

## 2015-02-22 MED ORDER — NITROGLYCERIN 0.4 MG SL SUBL: 0.4000 mg | SUBLINGUAL_TABLET | SUBLINGUAL | Status: DC | PRN

## 2015-02-22 NOTE — Patient Instructions (Addendum)
Medication Instructions:  NONE Labwork: NONE  Testing/Procedures: CA SCORE   TODAY  OUT OF POCKET $150.00 Follow-Up: Your physician wants you to follow-up in: [place] will receive a reminder letter in the mail two months in advance. If you don't receive a letter, please call our office to schedule the follow-up appointment.  Any Other Special Instructions Will Be Listed Below (If Applicable).

## 2015-02-23 ENCOUNTER — Other Ambulatory Visit (INDEPENDENT_AMBULATORY_CARE_PROVIDER_SITE_OTHER): Payer: BC Managed Care – PPO

## 2015-02-23 ENCOUNTER — Encounter: Payer: Self-pay | Admitting: *Deleted

## 2015-02-23 ENCOUNTER — Other Ambulatory Visit: Payer: Self-pay | Admitting: *Deleted

## 2015-02-23 DIAGNOSIS — R931 Abnormal findings on diagnostic imaging of heart and coronary circulation: Secondary | ICD-10-CM

## 2015-02-23 DIAGNOSIS — I2510 Atherosclerotic heart disease of native coronary artery without angina pectoris: Secondary | ICD-10-CM

## 2015-02-23 LAB — PROTIME-INR
INR: 1.2 ratio — AB (ref 0.8–1.0)
PROTHROMBIN TIME: 13.3 s — AB (ref 9.6–13.1)

## 2015-02-23 LAB — CBC WITH DIFFERENTIAL/PLATELET
BASOS PCT: 0.3 % (ref 0.0–3.0)
Basophils Absolute: 0 10*3/uL (ref 0.0–0.1)
Eosinophils Absolute: 0.2 10*3/uL (ref 0.0–0.7)
Eosinophils Relative: 2.9 % (ref 0.0–5.0)
HEMATOCRIT: 43.6 % (ref 39.0–52.0)
Hemoglobin: 15.1 g/dL (ref 13.0–17.0)
LYMPHS ABS: 1.6 10*3/uL (ref 0.7–4.0)
Lymphocytes Relative: 20.3 % (ref 12.0–46.0)
MCHC: 34.7 g/dL (ref 30.0–36.0)
MCV: 88.6 fl (ref 78.0–100.0)
MONO ABS: 0.5 10*3/uL (ref 0.1–1.0)
Monocytes Relative: 6 % (ref 3.0–12.0)
Neutro Abs: 5.6 10*3/uL (ref 1.4–7.7)
Neutrophils Relative %: 70.5 % (ref 43.0–77.0)
PLATELETS: 272 10*3/uL (ref 150.0–400.0)
RBC: 4.92 Mil/uL (ref 4.22–5.81)
RDW: 12.7 % (ref 11.5–15.5)
WBC: 8 10*3/uL (ref 4.0–10.5)

## 2015-02-23 LAB — BASIC METABOLIC PANEL
BUN: 13 mg/dL (ref 6–23)
CALCIUM: 9.7 mg/dL (ref 8.4–10.5)
CO2: 29 meq/L (ref 19–32)
Chloride: 103 mEq/L (ref 96–112)
Creatinine, Ser: 0.73 mg/dL (ref 0.40–1.50)
GFR: 115.93 mL/min (ref >60.00)
Glucose, Bld: 117 mg/dL — ABNORMAL HIGH (ref 70–99)
Potassium: 3.7 mEq/L (ref 3.5–5.1)
SODIUM: 138 meq/L (ref 135–145)

## 2015-02-25 ENCOUNTER — Encounter (HOSPITAL_COMMUNITY)
Admission: RE | Disposition: A | Discharge status: Home / Self Care | Payer: BC Managed Care – PPO | Source: Ambulatory Visit | Attending: Interventional Cardiology

## 2015-02-25 ENCOUNTER — Ambulatory Visit (HOSPITAL_COMMUNITY)
Admission: RE | Admit: 2015-02-25 | Discharge: 2015-02-25 | Disposition: A | Discharge status: Home / Self Care | Payer: BC Managed Care – PPO | Source: Ambulatory Visit | Attending: Interventional Cardiology | Admitting: Interventional Cardiology

## 2015-02-25 DIAGNOSIS — F17210 Nicotine dependence, cigarettes, uncomplicated: Secondary | ICD-10-CM | POA: Insufficient documentation

## 2015-02-25 DIAGNOSIS — K219 Gastro-esophageal reflux disease without esophagitis: Secondary | ICD-10-CM | POA: Diagnosis not present

## 2015-02-25 DIAGNOSIS — E780 Pure hypercholesterolemia: Secondary | ICD-10-CM | POA: Insufficient documentation

## 2015-02-25 DIAGNOSIS — Z79899 Other long term (current) drug therapy: Secondary | ICD-10-CM | POA: Insufficient documentation

## 2015-02-25 DIAGNOSIS — I2510 Atherosclerotic heart disease of native coronary artery without angina pectoris: Secondary | ICD-10-CM | POA: Diagnosis not present

## 2015-02-25 DIAGNOSIS — Z7982 Long term (current) use of aspirin: Secondary | ICD-10-CM | POA: Diagnosis not present

## 2015-02-25 DIAGNOSIS — R079 Chest pain, unspecified: Secondary | ICD-10-CM | POA: Diagnosis present

## 2015-02-25 DIAGNOSIS — E785 Hyperlipidemia, unspecified: Secondary | ICD-10-CM | POA: Diagnosis not present

## 2015-02-25 DIAGNOSIS — I2584 Coronary atherosclerosis due to calcified coronary lesion: Secondary | ICD-10-CM | POA: Diagnosis not present

## 2015-02-25 DIAGNOSIS — Z8249 Family history of ischemic heart disease and other diseases of the circulatory system: Secondary | ICD-10-CM | POA: Diagnosis not present

## 2015-02-25 DIAGNOSIS — I10 Essential (primary) hypertension: Secondary | ICD-10-CM | POA: Insufficient documentation

## 2015-02-25 DIAGNOSIS — E1165 Type 2 diabetes mellitus with hyperglycemia: Secondary | ICD-10-CM | POA: Insufficient documentation

## 2015-02-25 DIAGNOSIS — Z720 Tobacco use: Secondary | ICD-10-CM | POA: Diagnosis present

## 2015-02-25 DIAGNOSIS — E119 Type 2 diabetes mellitus without complications: Secondary | ICD-10-CM

## 2015-02-25 HISTORY — PX: CARDIAC CATHETERIZATION: SHX172

## 2015-02-25 LAB — GLUCOSE, CAPILLARY: Glucose-Capillary: 140 mg/dL — ABNORMAL HIGH (ref 65–99)

## 2015-02-25 SURGERY — LEFT HEART CATH AND CORONARY ANGIOGRAPHY

## 2015-02-25 MED ORDER — NITROGLYCERIN 1 MG/10 ML FOR IR/CATH LAB
INTRA_ARTERIAL | Status: AC
  Filled 2015-02-25: qty 10

## 2015-02-25 MED ORDER — SODIUM CHLORIDE 0.9 % IJ SOLN: 3.0000 mL | INTRAMUSCULAR | Status: DC | PRN

## 2015-02-25 MED ORDER — LIDOCAINE HCL (PF) 1 % IJ SOLN
INTRAMUSCULAR | Status: DC | PRN
  Administered 2015-02-25: 2 mL via INTRADERMAL

## 2015-02-25 MED ORDER — HEPARIN SODIUM (PORCINE) 1000 UNIT/ML IJ SOLN
INTRAMUSCULAR | Status: AC
  Filled 2015-02-25: qty 1

## 2015-02-25 MED ORDER — HEPARIN (PORCINE) IN NACL 2-0.9 UNIT/ML-% IJ SOLN
INTRAMUSCULAR | Status: AC
  Filled 2015-02-25: qty 1500

## 2015-02-25 MED ORDER — FENTANYL CITRATE (PF) 100 MCG/2ML IJ SOLN
INTRAMUSCULAR | Status: AC
  Filled 2015-02-25: qty 4

## 2015-02-25 MED ORDER — LIDOCAINE HCL (PF) 1 % IJ SOLN
INTRAMUSCULAR | Status: AC
  Filled 2015-02-25: qty 30

## 2015-02-25 MED ORDER — CLOPIDOGREL BISULFATE 75 MG PO TABS: 75.0000 mg | ORAL_TABLET | Freq: Every day | ORAL | Status: DC

## 2015-02-25 MED ORDER — SODIUM CHLORIDE 0.9 % IJ SOLN: 3.0000 mL | Freq: Two times a day (BID) | INTRAMUSCULAR | Status: DC

## 2015-02-25 MED ORDER — SODIUM CHLORIDE 0.9 % WEIGHT BASED INFUSION: 3.0000 mL/kg/h | INTRAVENOUS | Status: DC

## 2015-02-25 MED ORDER — SODIUM CHLORIDE 0.9 % WEIGHT BASED INFUSION
1.0000 mL/kg/h | INTRAVENOUS | Status: DC
  Administered 2015-02-25: 1 mL/kg/h via INTRAVENOUS

## 2015-02-25 MED ORDER — ROSUVASTATIN CALCIUM 40 MG PO TABS: 40.0000 mg | ORAL_TABLET | Freq: Every day | ORAL | Status: DC

## 2015-02-25 MED ORDER — ASPIRIN 81 MG PO CHEW: 81.0000 mg | CHEWABLE_TABLET | ORAL | Status: AC

## 2015-02-25 MED ORDER — VERAPAMIL HCL 2.5 MG/ML IV SOLN
INTRAVENOUS | Status: AC
  Filled 2015-02-25: qty 2

## 2015-02-25 MED ORDER — VERAPAMIL HCL 2.5 MG/ML IV SOLN
INTRAVENOUS | Status: DC | PRN
  Administered 2015-02-25: 08:00:00 via INTRA_ARTERIAL

## 2015-02-25 MED ORDER — FENTANYL CITRATE (PF) 100 MCG/2ML IJ SOLN
INTRAMUSCULAR | Status: DC | PRN
  Administered 2015-02-25 (×2): 50 ug via INTRAVENOUS

## 2015-02-25 MED ORDER — MIDAZOLAM HCL 2 MG/2ML IJ SOLN
INTRAMUSCULAR | Status: DC | PRN
  Administered 2015-02-25 (×2): 1 mg via INTRAVENOUS

## 2015-02-25 MED ORDER — NITROGLYCERIN 1 MG/10 ML FOR IR/CATH LAB
INTRA_ARTERIAL | Status: DC | PRN
  Administered 2015-02-25: 200 ug via INTRACORONARY

## 2015-02-25 MED ORDER — SODIUM CHLORIDE 0.9 % IV SOLN: 250.0000 mL | INTRAVENOUS | Status: DC | PRN

## 2015-02-25 MED ORDER — ACETAMINOPHEN 325 MG PO TABS: 650.0000 mg | ORAL_TABLET | ORAL | Status: DC | PRN

## 2015-02-25 MED ORDER — HEPARIN SODIUM (PORCINE) 1000 UNIT/ML IJ SOLN
INTRAMUSCULAR | Status: DC | PRN
  Administered 2015-02-25: 5000 [IU] via INTRAVENOUS

## 2015-02-25 MED ORDER — ROSUVASTATIN CALCIUM 40 MG PO TABS
40.0000 mg | ORAL_TABLET | Freq: Every day | ORAL | Status: DC
  Filled 2015-02-25: qty 1

## 2015-02-25 MED ORDER — SODIUM CHLORIDE 0.9 % WEIGHT BASED INFUSION
3.0000 mL/kg/h | INTRAVENOUS | Status: AC
  Administered 2015-02-25: 3 mL/kg/h via INTRAVENOUS

## 2015-02-25 MED ORDER — ONDANSETRON HCL 4 MG/2ML IJ SOLN: 4.0000 mg | Freq: Four times a day (QID) | INTRAMUSCULAR | Status: DC | PRN

## 2015-02-25 MED ORDER — MIDAZOLAM HCL 2 MG/2ML IJ SOLN
INTRAMUSCULAR | Status: AC
  Filled 2015-02-25: qty 4

## 2015-02-25 SURGICAL SUPPLY — 13 items
CATH INFINITI 5 FR JL3.5 (CATHETERS) ×3 IMPLANT
CATH INFINITI JR4 5F (CATHETERS) ×3 IMPLANT
CATH LAUNCHER 5F EBU3.5 (CATHETERS) ×3 IMPLANT
CATH SITESEER 5F MULTI A 2 (CATHETERS) IMPLANT
DEVICE RAD COMP TR BAND LRG (VASCULAR PRODUCTS) ×3 IMPLANT
GLIDESHEATH SLEND A-KIT 6F 22G (SHEATH) ×3 IMPLANT
KIT HEART LEFT (KITS) ×3 IMPLANT
PACK CARDIAC CATHETERIZATION (CUSTOM PROCEDURE TRAY) ×3 IMPLANT
SHEATH PINNACLE 5F 10CM (SHEATH) IMPLANT
TRANSDUCER W/STOPCOCK (MISCELLANEOUS) ×3 IMPLANT
TUBING CIL FLEX 10 FLL-RA (TUBING) ×3 IMPLANT
WIRE EMERALD 3MM-J .035X150CM (WIRE) IMPLANT
WIRE SAFE-T 1.5MM-J .035X260CM (WIRE) ×3 IMPLANT

## 2015-02-25 NOTE — Discharge Instructions (Signed)
Radial Site Care °Refer to this sheet in the next few weeks. These instructions provide you with information on caring for yourself after your procedure. Your caregiver may also give you more specific instructions. Your treatment has been planned according to current medical practices, but problems sometimes occur. Call your caregiver if you have any problems or questions after your procedure. °HOME CARE INSTRUCTIONS °· You may shower the day after the procedure. Remove the bandage (dressing) and gently wash the site with plain soap and water. Gently pat the site dry. °· Do not apply powder or lotion to the site. °· Do not submerge the affected site in water for 3 to 5 days. °· Inspect the site at least twice daily. °· Do not flex or bend the affected arm for 24 hours. °· No lifting over 5 pounds (2.3 kg) for 5 days after your procedure. °· Do not drive home if you are discharged the same day of the procedure. Have someone else drive you. °· You may drive 24 hours after the procedure unless otherwise instructed by your caregiver. °· Do not operate machinery or power tools for 24 hours. °· A responsible adult should be with you for the first 24 hours after you arrive home. °What to expect: °· Any bruising will usually fade within 1 to 2 weeks. °· Blood that collects in the tissue (hematoma) may be painful to the touch. It should usually decrease in size and tenderness within 1 to 2 weeks. °SEEK IMMEDIATE MEDICAL CARE IF: °· You have unusual pain at the radial site. °· You have redness, warmth, swelling, or pain at the radial site. °· You have drainage (other than a small amount of blood on the dressing). °· You have chills. °· You have a fever or persistent symptoms for more than 72 hours. °· You have a fever and your symptoms suddenly get worse. °· Your arm becomes pale, cool, tingly, or numb. °· You have heavy bleeding from the site. Hold pressure on the site. °Document Released: 08/11/2010 Document Revised:  10/01/2011 Document Reviewed: 08/11/2010 °[employer]® Patient Information ©2015 [employer]. This information is not intended to replace advice given to you by your health care provider. Make sure you discuss any questions you have with your health care provider. ° °

## 2015-02-25 NOTE — H&P (View-Only) (Signed)
Patient ID: Gregory Fox, male   DOB: Apr 13, 1956, 59 y.o.   MRN: [id number]     Cardiology Office Note   Date:  02/20/2015   ID:  Gregory, Fox 1956/01/09, MRN [id number]  PCP:  Gregory Sake, MD  Cardiologist:   Gregory Rouge, MD   No chief complaint on file.     History of Present Illness: Gregory Fox is a 59 y.o. male who presents for evaluation by primary last seen in 2013 for palpitations and atypical chest pain with negative w/u  01/31/12 Overall Impression: Probable normal perfusion and soft tissue attenuation (diaphragm). Overall low risk scan.  LV Ejection Fraction: 61%. LV Wall Motion: NL LV Function; NL Wall Motion  Echo done 01/25/12 reviewed and normal with no valve disease and EF 60%  Both studies reviewed.  CRF;s include HTN, elevated lipids and Type 2 DM.  Has been on beta blocker for HTN and palpitations in past   Reviewed primary records. Gregory Fox   Recent LDL 84 on statin. He has had angioedema with lisinopril before Last A1c 7.2    Recently hospitalized for SSCP.  D/C on 7/23.  Seen by Gregory Gregory Fox.  Extensive w/u with normal myovue, negative CTA for PE  R/O and no acute ECG changes  Did have low grade fever while  In hospital. Described precordial pressure/dull pain rated at 7/10 in severity, radiating to left upper extremity, associated with mild dyspnea but no nausea or diaphoresis. This pain was unlike his GERD pain. He proceeded to drink water and eat dinner without relief. At times chest pain seems worse with deep inspiration. No cough reported. No history of lifting or moving any heavy objects. Subsequently pain got worse to 9/10 in severity. He thereby presented to the ED. No complaints of asymmetrical leg pain or swelling. In the ED, sublingual nitroglycerin in did not provide relief and neither did a dose of IV morphine 4 mg 1. Subsequently received IV Dilaudid 1 MG 1 and pain subsided to 5/10 in severity  Since d/c has been ok.  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  Smokes 3 cigs/day and chews [place] all day.  BS poorly controlled and meds adjusted by Gregory Fox recently   Has trip to [place] planned soon   Reviewed Epic notes from recent records time alone 30 minutes   Past Medical History  Diagnosis Date  . Hypertension   . Hypercholesteremia   . Type II diabetes mellitus   . Heart murmur   . GERD (gastroesophageal reflux disease)   . Gout   . Taste absent 01/24/12    "had wisdom tooth pulled; couple days later 2 crowns put in; can't taste since"    Past Surgical History  Procedure Laterality Date  . No past surgeries       Current Outpatient Prescriptions  Medication Sig Dispense Refill  . amLODipine (NORVASC) 5 MG tablet Take 5 mg by mouth daily. for high blood pressure  1  . aspirin EC 81 MG tablet Take 81 mg by mouth daily.    . carvedilol (COREG) 12.5 MG tablet Take 12.5 mg by mouth 2 (two) times daily.  0  . esomeprazole (NEXIUM) 20 MG capsule Take 20 mg by mouth daily at 12 noon.    Marland Kitchen LANTUS SOLOSTAR 100 UNIT/ML Solostar Pen Inject 63 Units into the skin at bedtime.  5  . lisinopril (PRINIVIL,ZESTRIL) 20 MG tablet Take 20 mg by mouth daily.    Marland Kitchen  rosuvastatin (CRESTOR) 5 MG tablet Take 5 mg by mouth daily.  1  . TANZEUM 30 MG PEN Inject 30 mg into the skin once a week. Takes on sundays.     No current facility-administered medications for this visit.    Allergies:   Sulfa antibiotics    Social History:  The patient  reports that he has been smoking.  He uses smokeless tobacco. He reports that he drinks about 4.2 oz of alcohol per week. He reports that he does not use illicit drugs.   Family History:  The patient's family history includes CAD in his father.    ROS:  Please see the history of present illness.   Otherwise, review of systems are positive for none.   All other systems are reviewed and negative.    PHYSICAL EXAM: VS:  There were no vitals taken for this visit. ,  BMI There is no weight on file to calculate BMI. Affect appropriate Healthy:  appears stated age 47: normal Neck supple with no adenopathy JVP normal no bruits no thyromegaly Lungs clear with no wheezing and good diaphragmatic motion Heart:  S1/S2 no murmur, no rub, gallop or click PMI normal Abdomen: benighn, BS positve, no tenderness, no AAA no bruit.  No HSM or HJR Distal pulses intact with no bruits No edema Neuro non-focal Skin warm and dry No muscular weakness    EKG:  02/13/15  SR rate 71 normal ECG    Recent Labs: 02/09/2015: BUN 11; Creatinine, Ser 0.83; Potassium 3.6; Sodium 136 02/10/2015: TSH 1.767 02/12/2015: Hemoglobin 13.6; Platelets 170    Lipid Panel    Component Value Date/Time   CHOL 163 01/25/2012 0620   TRIG 278* 01/25/2012 0620   HDL 29* 01/25/2012 0620   CHOLHDL 5.6 01/25/2012 0620   VLDL 56* 01/25/2012 0620   LDLCALC 78 01/25/2012 0620      Wt Readings from Last 3 Encounters:  02/12/15 101.197 kg (223 lb 1.6 oz)  02/21/12 99.791 kg (220 lb)  01/31/12 97.07 kg (214 lb)      Other studies Reviewed: Additional studies/ records that were reviewed today include: Epic notes recent hospitalization and notes primary Gregory Fox.    ASSESSMENT AND PLAN:  1.  Chest Pain:  Myovue images wit some inferior attenuation.  Multiple poorly controlled risk factors  Reassuring that ECG/enzymes normal.  Pain also did not respond To nitro or high doses of narcotics.  Do not think cath indicated.  Will get calcium score to make sure it is not very high.  If his calcium score is extremely high with his risk  Factor profile would be more inclined to cath him given the severity of his symptoms and risk profile.  Continue ASA and beta blocker nitro called in   2. Smoking counseled for less than 10 minutes given snuff use suggested nicotine replacement    3. DM:  Discussed low carb diet Travel history makes good diet difficult  Lantus dose recently adjusted     4. HTN:  Well controlled.  Continue current medications and low sodium Dash type diet.    5. Chol:  Continue crestor labs with primary    Current medicines are reviewed at length with the patient today.  The patient does not have concerns regarding medicines.  The following changes have been made:  no change  Labs/ tests ordered today include:  Calcium Score   No orders of the defined types were placed in this encounter.     Addendum:  Calcium score was extremely elevated at 1569 including dense calcification of the entire LAD.  96th percentile for age and sex matched controls Given 3 vessel nature of calcium patient could have balanced ischemia.  Left message for him and would consider proceeding with heart cath this week to  Further risk stratify.    Gregory Fox   Disposition:   FU with me in 6 months      Signed, Gregory Rouge, MD  02/20/2015 2:06 PM    [facility] [address], [place], [place]  [address] Phone: [phone]; Fax: [phone]

## 2015-02-25 NOTE — Interval H&P Note (Signed)
Cath Lab Visit (complete for each Cath Lab visit)  Clinical Evaluation Leading to the Procedure:   ACS: No.  Non-ACS:    Anginal Classification: CCS III  Anti-ischemic medical therapy: Minimal Therapy (1 class of medications)  Non-Invasive Test Results: Low-risk stress test findings: cardiac mortality <1%/year  Prior CABG: No previous CABG      History and Physical Interval Note:  02/25/2015 7:43 AM  Gregory Fox  has presented today for surgery, with the diagnosis of elevated calsium score  The various methods of treatment have been discussed with the patient and family. After consideration of risks, benefits and other options for treatment, the patient has consented to  Procedure(s): Left Heart Cath and Coronary Angiography (N/A) as a surgical intervention .  The patient's history has been reviewed, patient examined, no change in status, stable for surgery.  I have reviewed the patient's chart and labs.  Questions were answered to the patient's satisfaction.     Gregory Fox

## 2015-02-25 NOTE — Research (Signed)
CADLAD Informed Consent   Subject Name: Gregory Fox  Subject met inclusion and exclusion criteria.  The informed consent form, study requirements and expectations were reviewed with the subject and questions and concerns were addressed prior to the signing of the consent form.  The subject verbalized understanding of the trail requirements.  The subject agreed to participate in the CADLAD trial and signed the informed consent.  The informed consent was obtained prior to performance of any protocol-specific procedures for the subject.  A copy of the signed informed consent was given to the subject and a copy was placed in the subject's medical record.  Hedrick,Dellie Piasecki W 02/25/2015, [id number]

## 2015-02-28 ENCOUNTER — Telehealth: Payer: Self-pay | Admitting: Cardiovascular Disease

## 2015-02-28 ENCOUNTER — Encounter (HOSPITAL_COMMUNITY): Payer: Self-pay | Admitting: Interventional Cardiology

## 2015-02-28 MED FILL — Verapamil HCl IV Soln 2.5 MG/ML: INTRAVENOUS | Qty: 2 | Status: AC

## 2015-02-28 MED FILL — Lidocaine HCl Local Preservative Free (PF) Inj 1%: INTRAMUSCULAR | Qty: 30 | Status: AC

## 2015-02-28 MED FILL — Heparin Sodium (Porcine) 2 Unit/ML in Sodium Chloride 0.9%: INTRAMUSCULAR | Qty: 500 | Status: AC

## 2015-02-28 NOTE — Telephone Encounter (Signed)
Left message for patient to call the office

## 2015-02-28 NOTE — Telephone Encounter (Signed)
New Message  Pt calling to speak w/ Dr Nishan/Christine about procedure done on Fri 8/5. Please call back and discuss.

## 2015-03-01 NOTE — Telephone Encounter (Signed)
Pt is aware of F/U appointment with Dr. Johnsie Cancel on 04/19/15 at 4:00 PM.

## 2015-03-01 NOTE — Telephone Encounter (Signed)
FU   Pt returning phone call. Please call back and discus.

## 2015-03-02 NOTE — Progress Notes (Signed)
Cardiology Office Note   Date:  03/03/2015   ID:  Gregory Fox, DOB 01/22/1956, MRN [id number]  PCP:  Gregory Sake, MD  Cardiologist:  Dr. Jenkins Fox   Electrophysiologist:  n/a  Chief Complaint  Patient presents with  . Hospitalization Follow-up    s/p cath  . Coronary Artery Disease     History of Present Illness: Gregory Fox is a 59 y.o. male with a hx of HTN, HL, diabetes. Recently hospitalized 01/2015 for chest pain. MI was ruled out. Nuclear study demonstrated no ischemia. CT was negative for pulmonary embolism. Seen in follow-up by Dr. Johnsie Fox 02/22/15. CT for calcium scoring was obtained. This was extremely elevated at 1569 including dense calcification of the entire LAD placing him in the 96th percentile for age and sex matched controls. Therefore, cardiac catheterization was arranged.  Cardiac catheterization was performed by Dr. Tamala Fox 02/25/15.  LHC demonstrated moderate nonobstructive 3 vessel CAD. Mid circumflex stenosis of 75% appeared angiographically significant. However, PCI was not performed as recent Myoview demonstrated no ischemia. Aggressive risk factor modification was recommended. If the patient had symptoms of angina, the circumflex could be treated. Rotational atherectomy would be needed. He was encouraged to use nitroglycerin as needed for any chest symptoms that could be anginal in nature.  He returns for FU.  He is here today with his wife. He denies chest pain. He denies significant dyspnea. He denies orthopnea, PND or edema. He denies syncope.    Studies/Reports Reviewed Today:  LHC 02/25/15 LM: Ostial 30% LAD: Ostial and mid 40%, mid to distal 65% LCx proximal to mid 75% RCA: Mid to distal 50%, distal 65% EF 55-65%  Prox to mid CFX 60-80% appears angiographically significant but with recent Cardiolite demonstrating no ischemia.  Moderate LAD and right coronary disease.  Normal left ventricular function. RECOMMENDATIONS:  Aggressive RF  modification. This includes aggressive control of DM, lipids, smoking cessation, and BP control.  I have increased the patient's Crestor from 5 mg daily to 40 mg daily. We added Plavix 75 mg per day to baby aspirin.  If symptoms, it would be reasonable to treat the circumflex. Rotational atherectomy would be required.  Early follow-up with Dr. Johnsie Fox.  I encouraged the patient to use sublingual nitroglycerin for "indigestion".  CT Cardiac Scoring 02/22/15 IMPRESSION:  Coronary calcium score of 1569. This was 96th percentile for age and sex matched control.  Venous Duplex 02/11/15 No evidence of deep vein thrombosis involving the right lowerextremity and left lower extremity.  Lexiscan Myoview 02/11/15 Inferolateral defect most consistent with diaphragmatic attenuation, no significant ischemia, EF 50%  Echo 02/10/15 Vigorous LV function, EF 65-70%, normal wall motion, grade 2 diastolic dysfunction, mild LAE, normal RV function  Myoview 01/2012 Probable normal perfusion and soft tissue attenuation (diaphragm). Overall low risk scan.  LV Ejection Fraction: 61%. LV Wall Motion: NL LV Function; NL Wall Motion   Past Medical History  Diagnosis Date  . Hypertension   . Hypercholesteremia   . Type II diabetes mellitus   . Heart murmur   . GERD (gastroesophageal reflux disease)   . Gout   . Taste absent 01/24/12    "had wisdom tooth pulled; couple days later 2 crowns put in; can't taste since"    Past Surgical History  Procedure Laterality Date  . No past surgeries    . Cardiac catheterization N/A 02/25/2015    Procedure: Left Heart Cath and Coronary Angiography;  Surgeon: Gregory Crome, MD;  Location: [facility];  Service: Cardiovascular;  Laterality: N/A;     Current Outpatient Prescriptions  Medication Sig Dispense Refill  . amLODipine (NORVASC) 5 MG tablet Take 5 mg by mouth daily. for high blood pressure  1  . aspirin EC 81 MG tablet Take 81 mg by mouth daily.    .  carvedilol (COREG) 12.5 MG tablet Take 12.5 mg by mouth 2 (two) times daily.  0  . clopidogrel (PLAVIX) 75 MG tablet Take 1 tablet (75 mg total) by mouth daily. 30 tablet 11  . LANTUS SOLOSTAR 100 UNIT/ML Solostar Pen Inject 56 Units into the skin at bedtime.   5  . nitroGLYCERIN (NITROSTAT) 0.4 MG SL tablet Place 1 tablet (0.4 mg total) under the tongue every 5 (five) minutes as needed for chest pain. 25 tablet 3  . Omega-3 Fatty Acids (FISH OIL) 1000 MG CAPS Take 1 capsule by mouth daily.    . rosuvastatin (CRESTOR) 40 MG tablet Take 1 tablet (40 mg total) by mouth daily at 6 PM. 30 tablet 11  . TANZEUM 30 MG PEN Inject 30 mg into the skin once a week. Takes on sundays.    . pantoprazole (PROTONIX) 40 MG tablet Take 1 tablet (40 mg total) by mouth daily. 30 tablet 11   No current facility-administered medications for this visit.    Allergies:   Sulfa antibiotics    Social History:  The patient  reports that he has been smoking.  He uses smokeless tobacco. He reports that he drinks about 4.2 oz of alcohol per week. He reports that he does not use illicit drugs.   Family History:  The patient's family history includes CAD in his father.    ROS:   Please see the history of present illness.   Review of Systems  HENT: Positive for hearing loss.   All other systems reviewed and are negative.     PHYSICAL EXAM: VS:  BP 118/74 mmHg  Pulse 74  Ht 5\' 11"  (1.803 m)  Wt 220 lb 3.2 oz (99.882 kg)  BMI 30.73 kg/m2    Wt Readings from Last 3 Encounters:  03/03/15 220 lb 3.2 oz (99.882 kg)  02/25/15 219 lb (99.338 kg)  02/22/15 223 lb 12.8 oz (101.515 kg)     GEN: Well nourished, well developed, in no acute distress HEENT: normal Neck: no JVD, no carotid bruits, no masses Cardiac:  Normal S1/S2, RRR; no murmur ,  no rubs or gallops, no edema; right wrist without hematoma or mass  Respiratory:  clear to auscultation bilaterally, no wheezing, rhonchi or rales. GI: soft, nontender,  nondistended, + BS MS: no deformity or atrophy Skin: warm and dry  Neuro:  CNs II-XII intact, Strength and sensation are intact Psych: Normal affect   EKG:  EKG is ordered today.  It demonstrates:   NSR, HR 74, poor R-wave progression, no ST changes   Recent Labs: 02/10/2015: TSH 1.767 02/23/2015: BUN 13; Creatinine, Ser 0.73; Hemoglobin 15.1; Platelets 272.0; Potassium 3.7; Sodium 138    Lipid Panel    Component Value Date/Time   CHOL 163 01/25/2012 0620   TRIG 278* 01/25/2012 0620   HDL 29* 01/25/2012 0620   CHOLHDL 5.6 01/25/2012 0620   VLDL 56* 01/25/2012 0620   LDLCALC 78 01/25/2012 0620      ASSESSMENT AND PLAN:  Coronary artery disease involving native coronary artery of native heart without angina pectoris:  Doing well. No angina. We discussed the importance of aggressive risk factor modification. Continue  aggressive blood pressure control, aggressive lipid control, aggressive diabetes control. I have recommended that he stop using smokeless tobacco as well as smoking cigarettes. Continue aspirin, Plavix, beta blocker, high-dose statin. I have recommended he continue to increase his activity.  He has a trip to [place] next week.  He is not having any unstable symptoms.  He may go on his trip.    Essential hypertension: Controlled.  Hyperlipidemia: Continue statin. Check lipids and LFTs in 6 weeks.  Type 2 diabetes mellitus with complication: Follow-up with primary care. Maintain hemoglobin A1c less than 6.5.  Tobacco abuse: I have recommended cessation as noted above.  GERD:  Change Nexium to Protonix now that he is on Plavix.    Medication Changes: Current medicines are reviewed at length with the patient today.  Concerns regarding medicines are as outlined above.  The following changes have been made:   Discontinued Medications   ESOMEPRAZOLE (NEXIUM) 20 MG CAPSULE    Take 20 mg by mouth daily at 12 noon.   Modified Medications   No medications on file   New  Prescriptions   PANTOPRAZOLE (PROTONIX) 40 MG TABLET    Take 1 tablet (40 mg total) by mouth daily.    Labs/ tests ordered today include:   Orders Placed This Encounter  Procedures  . Lipid Profile  . Hepatic function panel  . EKG 12-Lead     Disposition:   FU with Dr. Jenkins Fox next month as planned.    Signed, Versie Starks, MHS 03/03/2015 3:18 PM    [facility] [place], [place], [place]  [address] Phone: [phone]; Fax: [phone]

## 2015-03-03 ENCOUNTER — Encounter: Payer: Self-pay | Admitting: Physician Assistant

## 2015-03-03 ENCOUNTER — Ambulatory Visit (INDEPENDENT_AMBULATORY_CARE_PROVIDER_SITE_OTHER): Payer: BC Managed Care – PPO | Admitting: Physician Assistant

## 2015-03-03 VITALS — BP 118/74 | HR 74 | Ht 71.0 in | Wt 220.2 lb

## 2015-03-03 DIAGNOSIS — Z720 Tobacco use: Secondary | ICD-10-CM

## 2015-03-03 DIAGNOSIS — I10 Essential (primary) hypertension: Secondary | ICD-10-CM

## 2015-03-03 DIAGNOSIS — I2510 Atherosclerotic heart disease of native coronary artery without angina pectoris: Secondary | ICD-10-CM

## 2015-03-03 DIAGNOSIS — E785 Hyperlipidemia, unspecified: Secondary | ICD-10-CM | POA: Diagnosis not present

## 2015-03-03 DIAGNOSIS — E118 Type 2 diabetes mellitus with unspecified complications: Secondary | ICD-10-CM

## 2015-03-03 MED ORDER — PANTOPRAZOLE SODIUM 40 MG PO TBEC: 40.0000 mg | DELAYED_RELEASE_TABLET | Freq: Every day | ORAL | Status: DC

## 2015-03-03 NOTE — Patient Instructions (Signed)
Medication Instructions:  1. STOP NEXIUM  2. START PROTONIX 40 MG DAILY  Labwork: FASTING LIPID AND LIVER PANEL TO BE DONE A FEW DAYS BEFORE YOUR APPT WITH DR. Johnsie Cancel  Testing/Procedures: NONE  Follow-Up: KEEP YOUR APPT WITH DR. Johnsie Cancel 04/19/15  Any Other Special Instructions Will Be Listed Below (If Applicable).

## 2015-03-04 LAB — NM MYOCAR MULTI W/SPECT W/WALL MOTION / EF
Estimated workload: 1 METS
Exercise duration (min): 0 min
Exercise duration (sec): 0 s
MPHR: 0 {beats}/min
Peak HR: 97 {beats}/min
Percent HR: 0 %
RPE: 0
Rest HR: 74 {beats}/min

## 2015-04-15 ENCOUNTER — Other Ambulatory Visit (INDEPENDENT_AMBULATORY_CARE_PROVIDER_SITE_OTHER): Payer: BC Managed Care – PPO | Admitting: *Deleted

## 2015-04-15 ENCOUNTER — Telehealth: Payer: Self-pay | Admitting: Cardiovascular Disease

## 2015-04-15 DIAGNOSIS — I2510 Atherosclerotic heart disease of native coronary artery without angina pectoris: Secondary | ICD-10-CM

## 2015-04-15 DIAGNOSIS — E785 Hyperlipidemia, unspecified: Secondary | ICD-10-CM

## 2015-04-15 LAB — LIPID PANEL
CHOL/HDL RATIO: 3
Cholesterol: 90 mg/dL (ref 0–200)
HDL: 30.1 mg/dL — AB (ref >39.00)
LDL Cholesterol: 43 mg/dL (ref 0–99)
NONHDL: 59.7
Triglycerides: 85 mg/dL (ref 0.0–149.0)
VLDL: 17 mg/dL (ref 0.0–40.0)

## 2015-04-15 LAB — HEPATIC FUNCTION PANEL
ALT: 35 U/L (ref 0–53)
AST: 23 U/L (ref 0–37)
Albumin: 4.5 g/dL (ref 3.5–5.2)
Alkaline Phosphatase: 45 U/L (ref 39–117)
Bilirubin, Direct: 0.2 mg/dL (ref 0.0–0.3)
Total Bilirubin: 0.6 mg/dL (ref 0.2–1.2)
Total Protein: 7.1 g/dL (ref 6.0–8.3)

## 2015-04-15 NOTE — Telephone Encounter (Signed)
Left message to call back  

## 2015-04-15 NOTE — Telephone Encounter (Signed)
Follow Up   Pt is calling following up on blood work results. Please call.

## 2015-04-15 NOTE — Telephone Encounter (Signed)
Advised patient of lab results   Notes Recorded by Michae Kava, CMA on 04/15/2015 at 3:12 PM [email or web address] to go over lab results; Results sent to [facility] today as well. Notes Recorded by Liliane Shi, PA-C on 04/15/2015 at 12:43 PM Lipids at goal LFTs ok Continue with current treatment plan. Richardson Dopp, PA-C  04/15/2015 12:43 PM

## 2015-04-18 ENCOUNTER — Encounter: Payer: Self-pay | Admitting: *Deleted

## 2015-04-18 NOTE — Progress Notes (Signed)
Patient ID: Gregory Fox, male   DOB: [birth year], 59 y.o.   MRN: [id number]     Cardiology Office Note   Date:  04/19/2015   ID:  Gregory, Fox 07-05-1956, MRN [id number]  PCP:  [name], MD  Cardiologist:   Jenkins Rouge, MD   No chief complaint on file.     History of Present Illness: Gregory Fox is a 59 y.o. male who presents for evaluation by primary last seen in 2013 for palpitations and atypical chest pain with negative w/u  01/31/12 Overall Impression: Probable normal perfusion and soft tissue attenuation (diaphragm). Overall low risk scan.  LV Ejection Fraction: 61%. LV Wall Motion: NL LV Function; NL Wall Motion  Echo done 01/25/12 reviewed and normal with no valve disease and EF 60%  Both studies reviewed.  CRF;s include HTN, elevated lipids and Type 2 DM.  Has been on beta blocker for HTN and palpitations in past   Reviewed primary records. [name]   Recent LDL 84 on statin. He has had angioedema with lisinopril before Last A1c 7.2    Recently hospitalized for SSCP.  D/C on 7/23./16   Seen by Dr Jeffie Pollock.  Extensive w/u with normal myovue, negative CTA for PE  R/O and no acute ECG changes  Did have low grade fever while   In hospital. Described precordial pressure/dull pain rated at 7/10 in severity, radiating to left upper extremity, associated with mild dyspnea but no nausea or diaphoresis. 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  Smokes 3 cigs/day and chews copenhagen all day.  BS poorly controlled and meds adjusted by Dr Lisbeth Ply recently   Has trip to [place] planned soon   Reviewed Epic notes from recent records time alone 30 minutes     Did f/u calcium score on him that was extremely elevated  at 1569 including dense calcification of the entire LAD.  96th percentile for age and sex matched controls Given 3 vessel nature of calcium patient could have balanced ischemia.  Cath arranged.  Cath Dr Tamala Julian 02/25/15  Moderate  3VD worst in circumflex distribution 60-80%   Conclusion    1. Prox Cx to Mid Cx lesion, 75% stenosed. 2. Mid LAD to Dist LAD lesion, 65% stenosed. 3. Ost LAD to Mid LAD lesion, 40% stenosed. 4. Mid RCA to Dist RCA lesion, 50% stenosed. 5. Dist RCA lesion, 65% stenosed. 6. Ost LM to LM lesion, 30% stenosed.   Proximal to mid circumflex in the 60-80% range appears angiographically significant but with recent stress Cardiolite demonstrating no evidence of ischemia.  Moderate LAD and right coronary disease.  Normal left ventricular function.   Medical Rx recommended since myovue non ischemic.  Plavix added and crestor increased   Past Medical History  Diagnosis Date  . Hypertension   . Hypercholesteremia   . Type II diabetes mellitus   . Heart murmur   . GERD (gastroesophageal reflux disease)   . Gout   . Taste absent 01/24/12    "had wisdom tooth pulled; couple days later 2 crowns put in; can't taste since"    Past Surgical History  Procedure Laterality Date  . No past surgeries    . Cardiac catheterization N/A 02/25/2015    Procedure: Left Heart Cath and Coronary Angiography;  Surgeon: Belva Crome, MD;  Location: [facility];  Service: Cardiovascular;  Laterality: N/A;     Current Outpatient Prescriptions  Medication Sig  Dispense Refill  . amLODipine (NORVASC) 5 MG tablet Take 5 mg by mouth daily. for high blood pressure  1  . aspirin EC 81 MG tablet Take 81 mg by mouth daily.    . carvedilol (COREG) 12.5 MG tablet Take 12.5 mg by mouth 2 (two) times daily.  0  . clopidogrel (PLAVIX) 75 MG tablet Take 1 tablet (75 mg total) by mouth daily. 30 tablet 11  . LANTUS SOLOSTAR 100 UNIT/ML Solostar Pen Inject 56 Units into the skin at bedtime.   5  . nitroGLYCERIN (NITROSTAT) 0.4 MG SL tablet Place 0.4 mg under the tongue every 5 (five) minutes as needed for chest pain (3 doses MAX).    . Omega-3 Fatty Acids (FISH OIL) 1000 MG CAPS Take 1 capsule by mouth daily.    .  pantoprazole (PROTONIX) 40 MG tablet Take 1 tablet (40 mg total) by mouth daily. 30 tablet 11  . rosuvastatin (CRESTOR) 40 MG tablet Take 1 tablet (40 mg total) by mouth daily at 6 PM. 30 tablet 11  . TANZEUM 30 MG PEN Inject 30 mg into the skin once a week. Takes on sundays.     No current facility-administered medications for this visit.    Allergies:   Sulfa antibiotics    Social History:  The patient  reports that he has been smoking.  He uses smokeless tobacco. He reports that he drinks about 4.2 oz of alcohol per week. He reports that he does not use illicit drugs.   Family History:  The patient's family history includes CAD in his father.    ROS:  Please see the history of present illness.   Otherwise, review of systems are positive for none.   All other systems are reviewed and negative.    PHYSICAL EXAM: VS:  BP 130/64 mmHg  Pulse 85  Ht 5\' 11"  (1.803 m)  Wt 102.567 kg (226 lb 1.9 oz)  BMI 31.55 kg/m2 , BMI Body mass index is 31.55 kg/(m^2). Affect appropriate Healthy:  appears stated age 81: normal Neck supple with no adenopathy JVP normal no bruits no thyromegaly Lungs clear with no wheezing and good diaphragmatic motion Heart:  S1/S2 no murmur, no rub, gallop or click PMI normal Abdomen: benighn, BS positve, no tenderness, no AAA no bruit.  No HSM or HJR Distal pulses intact with no bruits No edema Neuro non-focal Skin warm and dry No muscular weakness    EKG:  02/13/15  SR rate 71 normal ECG    Recent Labs: 02/10/2015: TSH 1.767 02/23/2015: BUN 13; Creatinine, Ser 0.73; Hemoglobin 15.1; Platelets 272.0; Potassium 3.7; Sodium 138 04/15/2015: ALT 35    Lipid Panel    Component Value Date/Time   CHOL 90 04/15/2015 0752   TRIG 85.0 04/15/2015 0752   HDL 30.10* 04/15/2015 0752   CHOLHDL 3 04/15/2015 0752   VLDL 17.0 04/15/2015 0752   LDLCALC 43 04/15/2015 0752      Wt Readings from Last 3 Encounters:  04/19/15 102.567 kg (226 lb 1.9 oz)  03/03/15  99.882 kg (220 lb 3.2 oz)  02/25/15 99.338 kg (219 lb)      Other studies Reviewed: Additional studies/ records that were reviewed today include: Epic notes recent hospitalization and notes primary Dr Lisbeth Ply.    ASSESSMENT AND PLAN:  1.  Chest Pain: Moderate 3VD on medical RX  Has SL nitro  Reviewed his angio with him Tightest lesion is mid circumflex that only supplies As small OM  Can be stented for  positive stress test or clinical angina in future.  Continue DAT per Dr Tamala Julian  2. Smoking counseled for less than 10 minutes given snuff use suggested nicotine replacement    3. DM:  Discussed low carb diet Travel history makes good diet difficult  Lantus dose recently adjusted    4. HTN:  Well controlled.  Continue current medications and low sodium Dash type diet.    5. Chol:  Continue crestor labs with primary  Dose increased post cath 02/24/15    Current medicines are reviewed at length with the patient today.  The patient does not have concerns regarding medicines.  The following changes have been made:  no change  Labs/ tests ordered today include:  None   No orders of the defined types were placed in this encounter.    F/U with me in 3 months   Signed, Jenkins Rouge, MD  04/19/2015 3:48 PM    [facility] [address], [place], [place]  [address] Phone: [phone]; Fax: [phone]

## 2015-04-19 ENCOUNTER — Encounter: Payer: Self-pay | Admitting: Cardiovascular Disease

## 2015-04-19 ENCOUNTER — Ambulatory Visit (INDEPENDENT_AMBULATORY_CARE_PROVIDER_SITE_OTHER): Payer: BC Managed Care – PPO | Admitting: Cardiovascular Disease

## 2015-04-19 VITALS — BP 130/64 | HR 85 | Ht 71.0 in | Wt 226.1 lb

## 2015-04-19 DIAGNOSIS — E785 Hyperlipidemia, unspecified: Secondary | ICD-10-CM | POA: Diagnosis not present

## 2015-04-19 DIAGNOSIS — I10 Essential (primary) hypertension: Secondary | ICD-10-CM | POA: Diagnosis not present

## 2015-04-19 NOTE — Patient Instructions (Addendum)
Medication Instructions:  Your physician recommends that you continue on your current medications as directed. Please refer to the Current Medication list given to you today.   Labwork: NONE  Testing/Procedures: NONE  Follow-Up: Your physician recommends that you schedule a follow-up appointment in: [place] [facility]

## 2015-05-30 ENCOUNTER — Telehealth: Payer: Self-pay

## 2015-05-31 ENCOUNTER — Other Ambulatory Visit: Payer: Self-pay

## 2015-05-31 MED ORDER — NITROGLYCERIN 0.4 MG SL SUBL: 0.4000 mg | SUBLINGUAL_TABLET | SUBLINGUAL | Status: DC | PRN

## 2015-05-31 NOTE — Telephone Encounter (Signed)
Nitrostat refilled 

## 2015-06-29 NOTE — Progress Notes (Signed)
Patient ID: Gregory Fox, male   DOB: 11-11-1955, 59 y.o.   MRN: [id number]     Cardiology Office Note   Date:  07/11/2015   ID:  Gregory Fox, DOB 12-Jan-1956, MRN [id number]  PCP:  [name], MD  Cardiologist:   Jenkins Rouge, MD   Chief Complaint  Patient presents with  . Hypertension    no refills      History of Present Illness: [name] is a 59 y.o. male who presents for evaluation by primary last seen in 2013 for palpitations and atypical chest pain with negative w/u  01/31/12 Overall Impression: Probable normal perfusion and soft tissue attenuation (diaphragm). Overall low risk scan.  LV Ejection Fraction: 61%. LV Wall Motion: NL LV Function; NL Wall Motion  Echo done 01/25/12 reviewed and normal with no valve disease and EF 60%  Both studies reviewed.  CRF;s include HTN, elevated lipids and Type 2 DM.  Has been on beta blocker for HTN and palpitations in past   Reviewed primary records. [name]   Recent LDL 84 on statin. He has had angioedema with lisinopril before Last A1c 7.2    Recently hospitalized for SSCP.  D/C on 7/23./16   Seen by Dr Jeffie Pollock.  Extensive w/u with normal myovue, negative CTA for PE  R/O and no acute ECG changes  Did have low grade fever while   In hospital. Described precordial pressure/dull pain rated at 7/10 in severity, radiating to left upper extremity, associated with mild dyspnea but no nausea or diaphoresis. 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  Smokes 3 cigs/day and chews copenhagen all day.  BS poorly controlled and meds adjusted by Dr Lisbeth Ply recently   Has trip to [place] planned soon   Reviewed Epic notes from recent records time alone 30 minutes     Did f/u calcium score on him that was extremely elevated  at 1569 including dense calcification of the entire LAD.  96th percentile for age and sex matched controls Given 3 vessel nature of calcium patient could have balanced ischemia.  Cath  arranged.  Cath Dr Tamala Julian 02/25/15  Moderate 3VD worst in circumflex distribution 60-80%   Conclusion    1. Prox Cx to Mid Cx lesion, 75% stenosed. 2. Mid LAD to Dist LAD lesion, 65% stenosed. 3. Ost LAD to Mid LAD lesion, 40% stenosed. 4. Mid RCA to Dist RCA lesion, 50% stenosed. 5. Dist RCA lesion, 65% stenosed. 6. Ost LM to LM lesion, 30% stenosed.   Proximal to mid circumflex in the 60-80% range appears angiographically significant but with recent stress Cardiolite demonstrating no evidence of ischemia.  Moderate LAD and right coronary disease.  Normal left ventricular function.   Medical Rx recommended since myovue non ischemic.  Plavix added and crestor increased Some LE edema with prolonged standing LLE worse than right    Past Medical History  Diagnosis Date  . Hypertension   . Hypercholesteremia   . Type II diabetes mellitus ([place])   . Heart murmur   . GERD (gastroesophageal reflux disease)   . Gout   . Taste absent 01/24/12    "had wisdom tooth pulled; couple days later 2 crowns put in; can't taste since"    Past Surgical History  Procedure Laterality Date  . No past surgeries    . Cardiac catheterization N/A 02/25/2015    Procedure: Left Heart Cath and Coronary Angiography;  Surgeon: Belva Crome, MD;  Location: [facility];  Service: Cardiovascular;  Laterality: N/A;     Current Outpatient Prescriptions  Medication Sig Dispense Refill  . amLODipine (NORVASC) 5 MG tablet Take 5 mg by mouth daily. for high blood pressure  1  . aspirin EC 81 MG tablet Take 81 mg by mouth daily.    . carvedilol (COREG) 12.5 MG tablet Take 12.5 mg by mouth 2 (two) times daily.  0  . clopidogrel (PLAVIX) 75 MG tablet Take 1 tablet (75 mg total) by mouth daily. 30 tablet 11  . LANTUS SOLOSTAR 100 UNIT/ML Solostar Pen Inject 56 Units into the skin at bedtime.   5  . nitroGLYCERIN (NITROSTAT) 0.4 MG SL tablet Place 1 tablet (0.4 mg total) under the tongue every 5 (five) minutes  as needed for chest pain (3 doses MAX). 25 tablet 1  . Omega-3 Fatty Acids (FISH OIL) 1000 MG CAPS Take 1 capsule by mouth daily.    . pantoprazole (PROTONIX) 40 MG tablet Take 1 tablet (40 mg total) by mouth daily. 30 tablet 11  . rosuvastatin (CRESTOR) 40 MG tablet Take 1 tablet (40 mg total) by mouth daily at 6 PM. 30 tablet 11  . TANZEUM 30 MG PEN Inject 30 mg into the skin once a week. Takes on sundays.     No current facility-administered medications for this visit.    Allergies:   Sulfa antibiotics    Social History:  The patient  reports that he has been smoking.  He uses smokeless tobacco. He reports that he drinks about 4.2 oz of alcohol per week. He reports that he does not use illicit drugs.   Family History:  The patient's family history includes CAD in his father.    ROS:  Please see the history of present illness.   Otherwise, review of systems are positive for none.   All other systems are reviewed and negative.    PHYSICAL EXAM: VS:  BP 124/70 mmHg  Pulse 69  Ht 5\' 11"  (1.803 m)  Wt 105.688 kg (233 lb)  BMI 32.51 kg/m2 , BMI Body mass index is 32.51 kg/(m^2). Affect appropriate Healthy:  appears stated age 52: normal Neck supple with no adenopathy JVP normal no bruits no thyromegaly Lungs clear with no wheezing and good diaphragmatic motion Heart:  S1/S2 no murmur, no rub, gallop or click PMI normal Abdomen: benighn, BS positve, no tenderness, no AAA no bruit.  No HSM or HJR Distal pulses intact with no bruits No edema Neuro non-focal Skin warm and dry No muscular weakness    EKG:  02/13/15  SR rate 71 normal ECG    Recent Labs: 02/10/2015: TSH 1.767 02/23/2015: BUN 13; Creatinine, Ser 0.73; Hemoglobin 15.1; Platelets 272.0; Potassium 3.7; Sodium 138 04/15/2015: ALT 35    Lipid Panel    Component Value Date/Time   CHOL 90 04/15/2015 0752   TRIG 85.0 04/15/2015 0752   HDL 30.10* 04/15/2015 0752   CHOLHDL 3 04/15/2015 0752   VLDL 17.0  04/15/2015 0752   LDLCALC 43 04/15/2015 0752      Wt Readings from Last 3 Encounters:  07/11/15 105.688 kg (233 lb)  04/19/15 102.567 kg (226 lb 1.9 oz)  03/03/15 99.882 kg (220 lb 3.2 oz)      Other studies Reviewed: Additional studies/ records that were reviewed today include: Epic notes recent hospitalization and notes primary Dr Lisbeth Ply.    ASSESSMENT AND PLAN:  1.  Chest Pain: Moderate 3VD on medical RX  Has SL  nitro  Reviewed his angio with him Tightest lesion is mid circumflex that only supplies As small OM  Can be stented for positive stress test or clinical angina in future.  Continue DAT per Dr Tamala Julian F/U myovue 01/2016 will be a year   2. Smoking counseled for less than 10 minutes given snuff use suggested nicotine replacement    3. DM:  Discussed low carb diet Travel history makes good diet difficult  Lantus dose recently adjusted    4. HTN:  Well controlled.  Continue current medications and low sodium Dash type diet.    5. Chol:  Continue crestor labs with primary  Dose increased post cath 02/24/15   6. Edema:  Dependant PRN HCTZ called in Elevation and low sodium diet    Current medicines are reviewed at length with the patient today.  The patient does not have concerns regarding medicines.  The following changes have been made:  no change  Labs/ tests ordered today include:  None   No orders of the defined types were placed in this encounter.    F/U with me in 3 months   Signed, Jenkins Rouge, MD  07/11/2015 8:37 AM    [facility] [address], [place], [place]  [address] Phone: [phone]; Fax: [phone]

## 2015-07-11 ENCOUNTER — Ambulatory Visit (INDEPENDENT_AMBULATORY_CARE_PROVIDER_SITE_OTHER): Payer: BC Managed Care – PPO | Admitting: Cardiovascular Disease

## 2015-07-11 ENCOUNTER — Encounter: Payer: Self-pay | Admitting: Cardiovascular Disease

## 2015-07-11 VITALS — BP 124/70 | HR 69 | Ht 71.0 in | Wt 233.0 lb

## 2015-07-11 DIAGNOSIS — I10 Essential (primary) hypertension: Secondary | ICD-10-CM

## 2015-07-11 MED ORDER — HYDROCHLOROTHIAZIDE 12.5 MG PO CAPS: 12.5000 mg | ORAL_CAPSULE | Freq: Every day | ORAL | Status: DC

## 2015-07-11 NOTE — Patient Instructions (Signed)
Medication Instructions:  Your physician recommends that you continue on your current medications as directed. Please refer to the Current Medication list given to you today. HCTZ  (HYDROCHLOROTHIAZIDE ) 12.5 MG  1  EVERY DAY  AS NEEDED  Labwork: NONE  Testing/Procedures: NONE  Follow-Up: Your physician wants you to follow-up in: [place] will receive a reminder letter in the mail two months in advance. If you don't receive a letter, please call our office to schedule the follow-up appointment.  Any Other Special Instructions Will Be Listed Below (If Applicable).     If you need a refill on your cardiac medications before your next appointment, please call your pharmacy.

## 2016-02-19 ENCOUNTER — Other Ambulatory Visit: Payer: Self-pay | Admitting: Interventional Cardiology

## 2016-02-20 MED ORDER — CLOPIDOGREL BISULFATE 75 MG PO TABS: 75.0000 mg | ORAL_TABLET | Freq: Every day | ORAL | 6 refills | Status: DC

## 2016-02-20 NOTE — Addendum Note (Signed)
Addended by: Roberts Gaudy on: 02/20/2016 01:12 PM   Modules accepted: Orders

## 2016-02-27 NOTE — Progress Notes (Signed)
Patient ID: Gregory Fox, male   DOB: [birth year], 60 y.o.   MRN: [id number]     Cardiology Office Note   Date:  02/28/2016   ID:  Gregory Fox July 19, 1956, MRN [id number]  PCP:  Gregory Sake, MD  Cardiologist:   Gregory Rouge, MD   No chief complaint on file.     History of Present Illness: Gregory Fox is a 60 y.o. male who presents for evaluation by primary last seen in 2013 for palpitations and atypical chest pain with negative w/u  01/31/12 Overall Impression: Probable normal perfusion and soft tissue attenuation (diaphragm). Overall low risk scan.  LV Ejection Fraction: 61%. LV Wall Motion: NL LV Function; NL Wall Motion  Echo done 01/25/12 reviewed and normal with no valve disease and EF 60%  Both studies reviewed.  CRF;s include HTN, elevated lipids and Type 2 DM.  Has been on beta blocker for HTN and palpitations in past   Reviewed primary records. Gregory Fox   Recent LDL 84 on statin. He has had angioedema with lisinopril before Last A1c 7.2    Hospitalized for SSCP.  D/C on 7/23./16   Seen by Gregory Gregory Fox.  Extensive w/u with normal myovue, negative CTA for PE  R/O and no acute ECG changes  Did have low grade fever while   In hospital. Described precordial pressure/dull pain rated at 7/10 in severity, radiating to left upper extremity, associated with mild dyspnea but no nausea or diaphoresis. 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  Smokes 3 cigs/day and chews copenhagen all day.  BS poorly controlled and meds adjusted by Gregory Gregory Fox recently   Has trip to [place] planned soon   Reviewed Epic notes from recent records time alone 30 minutes     Did f/u calcium score on him that was extremely elevated  at 1569 including dense calcification of the entire LAD.  96th percentile for age and sex matched controls Given 3 vessel nature of calcium patient could have balanced ischemia.  Cath arranged.  Cath Gregory Tamala Julian 02/25/15  Moderate 3VD worst in  circumflex distribution 60-80%   Conclusion    1. Prox Cx to Mid Cx lesion, 75% stenosed. 2. Mid LAD to Dist LAD lesion, 65% stenosed. 3. Ost LAD to Mid LAD lesion, 40% stenosed. 4. Mid RCA to Dist RCA lesion, 50% stenosed. 5. Dist RCA lesion, 65% stenosed. 6. Ost LM to LM lesion, 30% stenosed.   Proximal to mid circumflex in the 60-80% range appears angiographically significant but with recent stress Cardiolite demonstrating no evidence of ischemia.  Moderate LAD and right coronary disease.  Normal left ventricular function.   Medical Rx recommended since myovue non ischemic.  Plavix added and crestor increased Some LE edema with prolonged standing LLE worse than right better with PRN diuretic  Only needed nitro twice since last visit.   Some calf pain with ambulation muscular   Past Medical History:  Diagnosis Date  . GERD (gastroesophageal reflux disease)   . Gout   . Heart murmur   . Hypercholesteremia   . Hypertension   . Taste absent 01/24/12   "had wisdom tooth pulled; couple days later 2 crowns put in; can't taste since"  . Type II diabetes mellitus ([place])     Past Surgical History:  Procedure Laterality Date  . CARDIAC CATHETERIZATION N/A 02/25/2015   Procedure: Left Heart Cath and Coronary Angiography;  Surgeon: Gregory Crome, MD;  Location: [facility];  Service: Cardiovascular;  Laterality: N/A;  . NO PAST SURGERIES       Current Outpatient Prescriptions  Medication Sig Dispense Refill  . amLODipine (NORVASC) 5 MG tablet Take 5 mg by mouth daily. for high blood pressure  1  . aspirin EC 81 MG tablet Take 81 mg by mouth daily.    . carvedilol (COREG) 12.5 MG tablet Take 12.5 mg by mouth 2 (two) times daily.  0  . clopidogrel (PLAVIX) 75 MG tablet Take 1 tablet (75 mg total) by mouth daily. 30 tablet 6  . hydrochlorothiazide (MICROZIDE) 12.5 MG capsule Take 1 capsule (12.5 mg total) by mouth daily. 90 capsule 1  . LANTUS SOLOSTAR 100 UNIT/ML Solostar  Pen Inject 56 Units into the skin at bedtime.   5  . nitroGLYCERIN (NITROSTAT) 0.4 MG SL tablet Place 1 tablet (0.4 mg total) under the tongue every 5 (five) minutes as needed for chest pain (3 doses MAX). 25 tablet 1  . Omega-3 Fatty Acids (FISH OIL) 1000 MG CAPS Take 1 capsule by mouth daily.    . pantoprazole (PROTONIX) 40 MG tablet Take 1 tablet (40 mg total) by mouth daily. 30 tablet 11  . rosuvastatin (CRESTOR) 40 MG tablet Take 1 tablet (40 mg total) by mouth daily at 6 PM. 30 tablet 11  . TANZEUM 30 MG PEN Inject 30 mg into the skin once a week. Takes on sundays.     No current facility-administered medications for this visit.     Allergies:   Sulfa antibiotics    Social History:  The patient  reports that he has quit smoking. He smoked 0.25 packs per day. He uses smokeless tobacco. He reports that he drinks about 4.2 oz of alcohol per week . He reports that he does not use drugs.   Family History:  The patient's family history includes CAD in his father.    ROS:  Please see the history of present illness.   Otherwise, review of systems are positive for none.   All other systems are reviewed and negative.    PHYSICAL EXAM: VS:  BP (!) 147/83   Pulse 69   Ht 5\' 11"  (1.803 m)   Wt 226 lb 12.8 oz (102.9 kg)   BMI 31.63 kg/m  , BMI Body mass index is 31.63 kg/m. Affect appropriate Healthy:  appears stated age 70: normal Neck supple with no adenopathy JVP normal no bruits no thyromegaly Lungs clear with no wheezing and good diaphragmatic motion Heart:  S1/S2 no murmur, no rub, gallop or click PMI normal Abdomen: benighn, BS positve, no tenderness, no AAA no bruit.  No HSM or HJR Distal pulses intact with no bruits No edema Neuro non-focal Skin warm and dry No muscular weakness    EKG:  02/13/15  SR rate 71 normal ECG  02/28/16  SR ate 71 normal    Recent Labs: 04/15/2015: ALT 35    Lipid Panel    Component Value Date/Time   CHOL 90 04/15/2015 0752   TRIG  85.0 04/15/2015 0752   HDL 30.10 (L) 04/15/2015 0752   CHOLHDL 3 04/15/2015 0752   VLDL 17.0 04/15/2015 0752   LDLCALC 43 04/15/2015 0752      Wt Readings from Last 3 Encounters:  02/28/16 226 lb 12.8 oz (102.9 kg)  07/11/15 233 lb (105.7 kg)  04/19/15 226 lb 1.9 oz (102.6 kg)      Other studies Reviewed: Additional studies/ records that were reviewed  today include: Epic notes recent hospitalization and notes primary Gregory Gregory Fox.    ASSESSMENT AND PLAN:  1.  Chest Pain: Moderate 3VD on medical RX  Has SL nitro  Reviewed his angio with him Tightest lesion is mid circumflex that only supplies a small OM  Can be stented for positive stress test or clinical angina in future.  Continue DAT per Gregory Tamala Julian    2. Smoking counseled for less than 10 minutes given snuff use suggested nicotine replacement    3. DM:  Discussed low carb diet Travel history makes good diet difficult  Lantus dose recently adjusted    4. HTN:  Well controlled.  Continue current medications and low sodium Dash type diet.    5. Chol:  Continue crestor labs with primary  Dose increased post cath 02/24/15   6. Edema:  Dependant PRN HCTZ PRN  Elevation and low sodium diet    Current medicines are reviewed at length with the patient today.  The patient does not have concerns regarding medicines.  The following changes have been made:  no change  Labs/ tests ordered today include:  None    Orders Placed This Encounter  Procedures  . EKG 12-Lead    F/U with me in 6 months   Signed, Gregory Rouge, MD  02/28/2016 9:40 AM    [facility] [place], [place], [place]  [address] Phone: [phone]; Fax: [phone]

## 2016-02-28 ENCOUNTER — Ambulatory Visit (INDEPENDENT_AMBULATORY_CARE_PROVIDER_SITE_OTHER): Payer: BC Managed Care – PPO | Admitting: Cardiovascular Disease

## 2016-02-28 ENCOUNTER — Encounter: Payer: Self-pay | Admitting: Cardiovascular Disease

## 2016-02-28 VITALS — BP 147/83 | HR 69 | Ht 71.0 in | Wt 226.8 lb

## 2016-02-28 DIAGNOSIS — I2510 Atherosclerotic heart disease of native coronary artery without angina pectoris: Secondary | ICD-10-CM | POA: Diagnosis not present

## 2016-02-28 NOTE — Patient Instructions (Signed)

## 2016-03-16 ENCOUNTER — Other Ambulatory Visit: Payer: Self-pay | Admitting: Interventional Cardiology

## 2016-09-02 ENCOUNTER — Other Ambulatory Visit: Payer: Self-pay | Admitting: Cardiovascular Disease

## 2016-09-07 NOTE — Progress Notes (Signed)
Patient ID: Gregory Fox, male   DOB: September 13, 1955, 61 y.o.   MRN: [id number]     Cardiology Office Note   Date:  09/19/2016   ID:  Gregory Fox, Gregory Fox 19-Sep-1955, MRN [id number]  PCP:  [name], MD  Cardiologist:   Jenkins Rouge, MD   Chief Complaint  Patient presents with  . Coronary Artery Disease      History of Present Illness: Gregory Fox is a 60 y.o. male who presents for f/u of CAD  Hospitalized for SSCP.  D/C on 7/23./16   Seen by Dr Jeffie Pollock.  Extensive w/u with normal myovue, negative CTA for PE  R/O and no acute ECG changes  Did have low grade fever while  In hospital. Described precordial pressure/dull pain rated at 7/10 in severity, radiating to left upper extremity, associated with mild dyspnea but no nausea or diaphoresis. 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  Smokes 3 cigs/day and chews [place] all day.  BS poorly controlled and meds adjusted by Dr Lisbeth Ply    Did f/u calcium score on him  02/22/15 that was extremely elevated  at 1569 including dense calcification of the entire LAD.  96th percentile for age and sex matched controls Given 3 vessel nature of calcium patient could have balanced ischemia.  Cath arranged.  Cath Dr Tamala Julian 02/25/15  Moderate 3VD worst in circumflex distribution 60-80%   Conclusion    1. Prox Cx to Mid Cx lesion, 75% stenosed. 2. Mid LAD to Dist LAD lesion, 65% stenosed. 3. Ost LAD to Mid LAD lesion, 40% stenosed. 4. Mid RCA to Dist RCA lesion, 50% stenosed. 5. Dist RCA lesion, 65% stenosed. 6. Ost LM to LM lesion, 30% stenosed.   Proximal to mid circumflex in the 60-80% range appears angiographically significant but with recent stress Cardiolite demonstrating no evidence of ischemia.  Moderate LAD and right coronary disease.  Normal left ventricular function.   Medical Rx recommended since myovue non ischemic.  Plavix added and crestor increased Some LE edema with prolonged standing LLE worse  than right better with PRN diuretic Stress from new job Traveling to [place], [place] and [place].  Company got sold And new one not working out. DM not well controlled going to start seeing Dr Buddy Duty    Past Medical History:  Diagnosis Date  . GERD (gastroesophageal reflux disease)   . Gout   . Heart murmur   . Hypercholesteremia   . Hypertension   . Taste absent 01/24/12   "had wisdom tooth pulled; couple days later 2 crowns put in; can't taste since"  . Type II diabetes mellitus ([place])     Past Surgical History:  Procedure Laterality Date  . CARDIAC CATHETERIZATION N/A 02/25/2015   Procedure: Left Heart Cath and Coronary Angiography;  Surgeon: Belva Crome, MD;  Location: [facility];  Service: Cardiovascular;  Laterality: N/A;  . NO PAST SURGERIES       Current Outpatient Prescriptions  Medication Sig Dispense Refill  . amLODipine (NORVASC) 5 MG tablet Take 5 mg by mouth daily. for high blood pressure  1  . aspirin EC 81 MG tablet Take 81 mg by mouth daily.    . carvedilol (COREG) 12.5 MG tablet Take 12.5 mg by mouth 2 (two) times daily.  0  . clopidogrel (PLAVIX) 75 MG tablet TAKE 1 TABLET (75 MG TOTAL) BY MOUTH DAILY. 30 tablet 5  . Dulaglutide (TRULICITY) [id number] [id number]  SOPN Inject 1 ampule into the skin once a week. Pt unsure of amount    . hydrochlorothiazide (MICROZIDE) 12.5 MG capsule TAKE 1 CAPSULE (12.5 MG TOTAL) BY MOUTH DAILY. 90 capsule 1  . Insulin Glargine (BASAGLAR KWIKPEN) 100 UNIT/ML SOPN Inject 80 Units into the skin at bedtime.    . nitroGLYCERIN (NITROSTAT) 0.4 MG SL tablet Place 1 tablet (0.4 mg total) under the tongue every 5 (five) minutes as needed for chest pain (3 doses MAX). 25 tablet 1  . Omega-3 Fatty Acids (FISH OIL) 1000 MG CAPS Take 1 capsule by mouth daily.    . pantoprazole (PROTONIX) 40 MG tablet Take 1 tablet (40 mg total) by mouth daily. 30 tablet 11  . rosuvastatin (CRESTOR) 40 MG tablet TAKE 1 TABLET (40 MG TOTAL) BY MOUTH DAILY AT  6 PM. 30 tablet 11   No current facility-administered medications for this visit.     Allergies:   Sulfa antibiotics    Social History:  The patient  reports that he has quit smoking. He smoked 0.25 packs per day. He uses smokeless tobacco. He reports that he drinks about 4.2 oz of alcohol per week . He reports that he does not use drugs.   Family History:  The patient's family history includes CAD in his father.    ROS:  Please see the history of present illness.   Otherwise, review of systems are positive for none.   All other systems are reviewed and negative.    PHYSICAL EXAM: VS:  BP 116/70   Pulse 85   Ht 5\' 11"  (1.803 m)   Wt 229 lb 6.4 oz (104.1 kg)   SpO2 96%   BMI 31.99 kg/m  , BMI Body mass index is 31.99 kg/m. Affect appropriate Healthy:  appears stated age 37: normal Neck supple with no adenopathy JVP normal no bruits no thyromegaly Lungs clear with no wheezing and good diaphragmatic motion Heart:  S1/S2 no murmur, no rub, gallop or click PMI normal Abdomen: benighn, BS positve, no tenderness, no AAA no bruit.  No HSM or HJR Distal pulses intact with no bruits No edema Neuro non-focal Skin warm and dry No muscular weakness    EKG:  02/13/15  SR rate 71 normal ECG  02/28/16  SR ate 71 normal    Recent Labs: No results found for requested labs within last 8760 hours.    Lipid Panel    Component Value Date/Time   CHOL 90 04/15/2015 0752   TRIG 85.0 04/15/2015 0752   HDL 30.10 (L) 04/15/2015 0752   CHOLHDL 3 04/15/2015 0752   VLDL 17.0 04/15/2015 0752   LDLCALC 43 04/15/2015 0752      Wt Readings from Last 3 Encounters:  09/19/16 229 lb 6.4 oz (104.1 kg)  02/28/16 226 lb 12.8 oz (102.9 kg)  07/11/15 233 lb (105.7 kg)      Other studies Reviewed: Additional studies/ records that were reviewed today include: Epic notes recent hospitalization and notes primary Dr Lisbeth Ply.    ASSESSMENT AND PLAN:  1.  Chest Pain: Moderate 3VD on medical  RX  Has SL nitro  Reviewed his angio with him Tightest lesion is mid circumflex that only supplies a small OM  Can be stented for positive stress test or clinical angina in future.  Continue DAT per Dr Tamala Julian   Will likely do f/u myovue in August   2. Smoking counseled for less than 10 minutes given snuff use suggested nicotine replacement  3. DM:  Discussed low carb diet Travel history makes good diet difficult  Lantus dose recently adjusted   To see Dr Buddy Duty endocrine soon   4. HTN:  Well controlled.  Continue current medications and low sodium Dash type diet.    5. Chol:  Continue crestor labs with primary  Dose increased post cath 02/24/15   6. Edema:  Dependant PRN HCTZ PRN  Elevation and low sodium diet    Current medicines are reviewed at length with the patient today.  The patient does not have concerns regarding medicines.  The following changes have been made:  no change  Labs/ tests ordered today include:  None    No orders of the defined types were placed in this encounter.   F/U with me in 6 months   Signed, Jenkins Rouge, MD  09/19/2016 8:34 AM    [address], [place], [place]  [address] Phone: [phone]; Fax: [phone]

## 2016-09-10 ENCOUNTER — Encounter: Payer: Self-pay | Admitting: Cardiovascular Disease

## 2016-09-16 ENCOUNTER — Other Ambulatory Visit: Payer: Self-pay | Admitting: Interventional Cardiology

## 2016-09-19 ENCOUNTER — Encounter: Payer: Self-pay | Admitting: Cardiovascular Disease

## 2016-09-19 ENCOUNTER — Ambulatory Visit (INDEPENDENT_AMBULATORY_CARE_PROVIDER_SITE_OTHER): Payer: BC Managed Care – PPO | Admitting: Cardiovascular Disease

## 2016-09-19 VITALS — BP 116/70 | HR 85 | Ht 71.0 in | Wt 229.4 lb

## 2016-09-19 DIAGNOSIS — I2510 Atherosclerotic heart disease of native coronary artery without angina pectoris: Secondary | ICD-10-CM | POA: Diagnosis not present

## 2016-09-19 NOTE — Patient Instructions (Signed)

## 2017-01-06 ENCOUNTER — Other Ambulatory Visit: Payer: Self-pay | Admitting: Cardiovascular Disease

## 2017-02-04 ENCOUNTER — Other Ambulatory Visit: Payer: Self-pay | Admitting: Cardiovascular Disease

## 2017-03-13 ENCOUNTER — Other Ambulatory Visit: Payer: Self-pay | Admitting: Interventional Cardiology

## 2017-04-04 ENCOUNTER — Other Ambulatory Visit: Payer: Self-pay | Admitting: Cardiovascular Disease

## 2017-04-15 NOTE — Progress Notes (Signed)
Patient ID: Gregory Fox, male   DOB: 1956/03/01, 61 y.o.   MRN: [id number]     Cardiology Office Note   Date:  04/17/2017   ID:  Gregory Fox, DOB 16-May-1956, MRN [id number]  PCP:  [name], MD  Cardiologist:   Jenkins Rouge, MD   No chief complaint on file.     History of Present Illness: Gregory Fox is a 61 y.o. male who presents for f/u of CAD  Hospitalized for SSCP.  D/C on 7/23./16   Seen by Dr Jeffie Pollock.  Extensive w/u with normal myovue, negative CTA for PE  R/O and no acute ECG changes  Did have low grade fever while  In hospital. Described precordial pressure/dull pain rated at 7/10 in severity, radiating to left upper extremity, associated with mild dyspnea but no nausea or diaphoresis. 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  Smokes 3 cigs/day and chews [place] all day.  BS poorly controlled and meds adjusted by Dr Lisbeth Ply    Did f/u calcium score on him  02/22/15 that was extremely elevated  at 1569 including dense calcification of the entire LAD.  96th percentile for age and sex matched controls Given 3 vessel nature of calcium patient could have balanced ischemia.  Cath arranged.  Cath Dr Tamala Julian 02/25/15  Moderate 3VD worst in circumflex distribution 60-80%   Conclusion    1. Prox Cx to Mid Cx lesion, 75% stenosed. 2. Mid LAD to Dist LAD lesion, 65% stenosed. 3. Ost LAD to Mid LAD lesion, 40% stenosed. 4. Mid RCA to Dist RCA lesion, 50% stenosed. 5. Dist RCA lesion, 65% stenosed. 6. Ost LM to LM lesion, 30% stenosed.   Proximal to mid circumflex in the 60-80% range appears angiographically significant but with recent stress Cardiolite demonstrating no evidence of ischemia.  Moderate LAD and right coronary disease.  Normal left ventricular function.   Medical Rx recommended since myovue non ischemic.  Plavix added and crestor increased Some LE edema with prolonged standing LLE worse than right better with PRN  diuretic Stress from new job Traveling to [place], [place] and [place].  Company got sold And new one not working out. 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 GERD (gastroesophageal reflux disease)   . Gout   . Heart murmur   . Hypercholesteremia   . Hypertension   . Taste absent 01/24/12   "had wisdom tooth pulled; couple days later 2 crowns put in; can't taste since"  . Type II diabetes mellitus ([place])     Past Surgical History:  Procedure Laterality Date  . CARDIAC CATHETERIZATION N/A 02/25/2015   Procedure: Left Heart Cath and Coronary Angiography;  Surgeon: Belva Crome, MD;  Location: [facility];  Service: Cardiovascular;  Laterality: N/A;  . NO PAST SURGERIES       Current Outpatient Prescriptions  Medication Sig Dispense Refill  . amLODipine (NORVASC) 5 MG tablet Take 5 mg by mouth daily. for high blood pressure  1  . aspirin EC 81 MG tablet Take 81 mg by mouth daily.    . carvedilol (COREG) 25 MG tablet Take 1 tablet (25 mg total) by mouth 2 (two) times daily. 180 tablet 3  .  clopidogrel (PLAVIX) 75 MG tablet TAKE 1 TABLET (75 MG TOTAL) BY MOUTH DAILY. 30 tablet 5  . Dulaglutide (TRULICITY) 6.28 BT/5.1VO SOPN Inject 1 ampule into the skin once a week. Pt unsure of amount    . hydrochlorothiazide (MICROZIDE) 12.5 MG capsule TAKE 1 CAPSULE (12.5 MG TOTAL) BY MOUTH DAILY. 90 capsule 3  . Insulin Glargine (BASAGLAR KWIKPEN) 100 UNIT/ML SOPN Inject 80 Units into the skin at bedtime.    Marland Kitchen KRILL OIL PO Take 1 tablet by mouth daily.    Marland Kitchen LYRICA 75 MG capsule Take 75 mg by mouth 2 (two) times daily.    . metFORMIN (GLUCOPHAGE-XR) 500 MG 24 hr tablet Take 500 mg by mouth 3 (three) times daily.     . nitroGLYCERIN (NITROSTAT) 0.4 MG SL tablet PLACE 1 TABLET UNDER TONGUE EVERY 5 MINUTES AS  NEEDED F0R CHEST PAIN (3 DOSES MAX) 25 tablet 3  . Omega-3 Fatty Acids (FISH OIL) 1000 MG CAPS Take 1 capsule by mouth daily.    . pantoprazole (PROTONIX) 40 MG tablet Take 1 tablet (40 mg total) by mouth daily. 30 tablet 11  . rosuvastatin (CRESTOR) 40 MG tablet TAKE 1 TABLET (40 MG TOTAL) BY MOUTH DAILY AT 6 PM. 30 tablet 11  . TRESIBA FLEXTOUCH 200 UNIT/ML SOPN Inject 40-60 mg as directed daily.    . TRULICITY 1.5 HY/0.7PX SOPN Inject 1.5 mg as directed once a week.     No current facility-administered medications for this visit.     Allergies:   Sulfa antibiotics    Social History:  The patient  reports that he has quit smoking. He smoked 0.25 packs per day. He uses smokeless tobacco. He reports that he drinks about 4.2 oz of alcohol per week . He reports that he does not use drugs.   Family History:  The patient's family history includes CAD in his father.    ROS:  Please see the history of present illness.   Otherwise, review of systems are positive for none.   All other systems are reviewed and negative.    PHYSICAL EXAM: VS:  BP (!) 142/84   Pulse 81   Ht 5\' 11"  (1.803 m)   Wt 222 lb (100.7 kg)   SpO2 98%   BMI 30.96 kg/m  , BMI Body mass index is 30.96 kg/m. Affect appropriate Healthy:  appears stated age 6: normal Neck supple with no adenopathy JVP normal no bruits no thyromegaly Lungs clear with no wheezing and good diaphragmatic motion Heart:  S1/S2 no murmur, no rub, gallop or click PMI normal Abdomen: benighn, BS positve, no tenderness, no AAA no bruit.  No HSM or HJR Distal pulses intact with no bruits No edema Neuro non-focal Skin warm and dry No muscular weakness     EKG:  02/13/15  SR rate 71 normal ECG  02/28/16  SR ate 71 normal  04/17/17 SR rate 81 normal    Recent Labs: No results found for requested labs within last 8760 hours.    Lipid Panel    Component Value Date/Time   CHOL 90 04/15/2015 0752   TRIG 85.0 04/15/2015 0752   HDL  30.10 (L) 04/15/2015 0752   CHOLHDL 3 04/15/2015 0752   VLDL 17.0 04/15/2015 0752   LDLCALC 43 04/15/2015 0752      Wt Readings from Last 3 Encounters:  04/17/17 222 lb (100.7 kg)  09/19/16 229 lb 6.4 oz (104.1 kg)  02/28/16 226 lb 12.8 oz (102.9 kg)  Other studies Reviewed: Additional studies/ records that were reviewed today include: Epic notes recent hospitalization and notes primary Dr Lisbeth Ply.    ASSESSMENT AND PLAN:  1.  Chest Pain: Moderate 3VD on medical RX  Has SL nitro  Reviewed his angio with him Tightest lesion is mid circumflex that only supplies a small OM  Will f/u ETT to r/o progression and ischemia increase coreg to 25 bid   2. Smoking Smoking cessation instruction/counseling given:  counseled patient on the dangers of tobacco use, advised patient to stop smoking, and reviewed strategies to maximize success  3. DM:  Discussed low carb diet Travel history makes good diet difficult  Lantus dose recently adjusted   To see Dr Buddy Duty endocrine soon   4. HTN:  Well controlled.  Continue current medications and low sodium Dash type diet.     5. Chol:  Continue crestor labs with primary  Dose increased post cath 02/24/15   6. Edema:  Dependant PRN HCTZ PRN  Elevation and low sodium diet    Current medicines are reviewed at length with the patient today.  The patient does not have concerns regarding medicines.  The following changes have been made:  Coreg 25 bid   Labs/ tests ordered today include:  ETT   Orders Placed This Encounter  Procedures  . EXERCISE TOLERANCE TEST  . EKG 12-Lead    F/U with me in 6 months   Signed, Jenkins Rouge, MD  04/17/2017 9:17 AM    [facility] [address], [place], [place]  [address] Phone: [phone]; Fax: [phone]

## 2017-04-17 ENCOUNTER — Encounter: Payer: Self-pay | Admitting: Cardiovascular Disease

## 2017-04-17 ENCOUNTER — Ambulatory Visit (INDEPENDENT_AMBULATORY_CARE_PROVIDER_SITE_OTHER): Payer: BC Managed Care – PPO | Admitting: Cardiovascular Disease

## 2017-04-17 VITALS — BP 142/84 | HR 81 | Ht 71.0 in | Wt 222.0 lb

## 2017-04-17 DIAGNOSIS — I2510 Atherosclerotic heart disease of native coronary artery without angina pectoris: Secondary | ICD-10-CM | POA: Diagnosis not present

## 2017-04-17 DIAGNOSIS — R0600 Dyspnea, unspecified: Secondary | ICD-10-CM | POA: Diagnosis not present

## 2017-04-17 DIAGNOSIS — R079 Chest pain, unspecified: Secondary | ICD-10-CM | POA: Diagnosis not present

## 2017-04-17 MED ORDER — CARVEDILOL 25 MG PO TABS: 25.0000 mg | ORAL_TABLET | Freq: Two times a day (BID) | ORAL | 3 refills | Status: DC

## 2017-04-17 NOTE — Patient Instructions (Addendum)
Medication Instructions:  Your physician has recommended you make the following change in your medication:  1- INCREASE Coreg 25 mg by mouth twice daily.  Labwork: NONE  Testing/Procedures: Your physician has requested that you have an exercise tolerance test. For further information please visit [email or web address]. Please also follow instruction sheet, as given.  Follow-Up: Your physician wants you to follow-up in: 6 months with Dr. Johnsie Cancel. You will receive a reminder letter in the mail two months in advance. If you don't receive a letter, please call our office to schedule the follow-up appointment.   If you need a refill on your cardiac medications before your next appointment, please call your pharmacy.

## 2017-04-25 ENCOUNTER — Ambulatory Visit (INDEPENDENT_AMBULATORY_CARE_PROVIDER_SITE_OTHER): Payer: BC Managed Care – PPO

## 2017-04-25 DIAGNOSIS — R079 Chest pain, unspecified: Secondary | ICD-10-CM | POA: Diagnosis not present

## 2017-04-25 DIAGNOSIS — I2510 Atherosclerotic heart disease of native coronary artery without angina pectoris: Secondary | ICD-10-CM | POA: Diagnosis not present

## 2017-04-25 DIAGNOSIS — R0600 Dyspnea, unspecified: Secondary | ICD-10-CM

## 2017-04-25 LAB — EXERCISE TOLERANCE TEST
CHL CUP STRESS STAGE 1 GRADE: 0 %
CHL CUP STRESS STAGE 1 HR: 86 {beats}/min
CHL CUP STRESS STAGE 1 SBP: 133 mmHg
CHL CUP STRESS STAGE 1 SPEED: 0 mph
CHL CUP STRESS STAGE 2 GRADE: 0 %
CHL CUP STRESS STAGE 2 HR: 83 {beats}/min
CHL CUP STRESS STAGE 4 GRADE: 10 %
CHL CUP STRESS STAGE 4 HR: 106 {beats}/min
CHL CUP STRESS STAGE 5 DBP: 76 mmHg
CHL CUP STRESS STAGE 5 GRADE: 12 %
CHL CUP STRESS STAGE 5 SPEED: 2.5 mph
CHL CUP STRESS STAGE 7 GRADE: 0 %
CHL CUP STRESS STAGE 7 HR: 117 {beats}/min
CHL CUP STRESS STAGE 7 SPEED: 0 mph
CHL CUP STRESS STAGE 8 DBP: 81 mmHg
CHL CUP STRESS STAGE 8 GRADE: 0 %
CHL CUP STRESS STAGE 8 HR: 99 {beats}/min
CHL CUP STRESS STAGE 8 SBP: 164 mmHg
CSEPED: 7 min
CSEPEW: 8.6 METS
CSEPPMHR: 87 %
Exercise duration (sec): 10 s
MPHR: 157 {beats}/min
Peak HR: 137 {beats}/min
Percent HR: 87 %
RPE: 17
Rest HR: 78 {beats}/min
Stage 1 DBP: 77 mmHg
Stage 2 Speed: 1 mph
Stage 3 Grade: 0.1 %
Stage 3 HR: 82 {beats}/min
Stage 3 Speed: 1 mph
Stage 4 DBP: 78 mmHg
Stage 4 SBP: 172 mmHg
Stage 4 Speed: 1.7 mph
Stage 5 HR: 127 {beats}/min
Stage 5 SBP: 196 mmHg
Stage 6 Grade: 14 %
Stage 6 HR: 137 {beats}/min
Stage 6 Speed: 3.3 mph
Stage 7 DBP: 79 mmHg
Stage 7 SBP: 198 mmHg
Stage 8 Speed: 0 mph

## 2017-09-12 ENCOUNTER — Encounter: Payer: Self-pay | Admitting: Cardiovascular Disease

## 2017-09-13 NOTE — Progress Notes (Signed)
Patient ID: Gregory Fox, male   DOB: 03/26/1956, 62 y.o.   MRN: [id number]     Cardiology Office Note   Date:  09/16/2017   ID:  Murrel, Bertram [birth year], MRN [id number]  PCP:  [name], MD  Cardiologist:   Jenkins Rouge, MD   No chief complaint on file.     History of Present Illness:  62 y.o. first seen for chest pain July 2016. Calcium score 1569 so diagnostic cath arranged With moderate 3 VD worse lesion in mid circumflex only supplies small OM  Myovue 03/04/15 non ischemic Medical Rx  Recommended. No angina and had ETT 04/25/17 also normal DM better controlled seeing Dr Buddy Duty  On 3 drug Rx for HTN  Not clear why not on ACE/ARB no allergy or cough history Normal renal funciton   Cath Dr Tamala Julian 02/25/15  Moderate 3VD worst in circumflex distribution 60-80%   Conclusion    1. Prox Cx to Mid Cx lesion, 75% stenosed. 2. Mid LAD to Dist LAD lesion, 65% stenosed. 3. Ost LAD to Mid LAD lesion, 40% stenosed. 4. Mid RCA to Dist RCA lesion, 50% stenosed. 5. Dist RCA lesion, 65% stenosed. 6. Ost LM to LM lesion, 30% stenosed.   Proximal to mid circumflex in the 60-80% range appears angiographically significant but with recent stress Cardiolite demonstrating no evidence of ischemia.  Moderate LAD and right coronary disease.  Normal left ventricular function.      Past Medical History:  Diagnosis Date  . GERD (gastroesophageal reflux disease)   . Gout   . Heart murmur   . Hypercholesteremia   . Hypertension   . Taste absent 01/24/12   "had wisdom tooth pulled; couple days later 2 crowns put in; can't taste since"  . Type II diabetes mellitus ([place])     Past Surgical History:  Procedure Laterality Date  . CARDIAC CATHETERIZATION N/A 02/25/2015   Procedure: Left Heart Cath and Coronary Angiography;  Surgeon: Belva Crome, MD;  Location: [facility];  Service: Cardiovascular;  Laterality: N/A;  . NO PAST SURGERIES       Current Outpatient Medications    Medication Sig Dispense Refill  . aspirin EC 81 MG tablet Take 81 mg by mouth daily.    . carvedilol (COREG) 25 MG tablet Take 1 tablet (25 mg total) by mouth 2 (two) times daily. 180 tablet 3  . clopidogrel (PLAVIX) 75 MG tablet TAKE 1 TABLET (75 MG TOTAL) BY MOUTH DAILY. 30 tablet 5  . Dulaglutide (TRULICITY) 0.34 VQ/2.5ZD SOPN Inject 1 ampule into the skin once a week. Pt unsure of amount    . Insulin Glargine (BASAGLAR KWIKPEN) 100 UNIT/ML SOPN Inject 80 Units into the skin at bedtime.    Marland Kitchen KRILL OIL PO Take 1 tablet by mouth daily.    Marland Kitchen LYRICA 75 MG capsule Take 75 mg by mouth 2 (two) times daily.    . metFORMIN (GLUCOPHAGE-XR) 500 MG 24 hr tablet Take 500 mg by mouth 3 (three) times daily.     . nitroGLYCERIN (NITROSTAT) 0.4 MG SL tablet PLACE 1 TABLET UNDER TONGUE EVERY 5 MINUTES AS NEEDED F0R CHEST PAIN (3 DOSES MAX) 25 tablet 3  . Omega-3 Fatty Acids (FISH OIL) 1000 MG CAPS Take 1 capsule by mouth daily.    . pantoprazole (PROTONIX) 40 MG tablet Take 1 tablet (40 mg total) by mouth daily. 30 tablet 11  . rosuvastatin (CRESTOR) 40 MG tablet TAKE 1 TABLET (40 MG  TOTAL) BY MOUTH DAILY AT 6 PM. 30 tablet 11  . TRESIBA FLEXTOUCH 200 UNIT/ML SOPN Inject 40-60 mg as directed daily.    . TRULICITY 1.5 KG/4.0NU SOPN Inject 1.5 mg as directed once a week.    . losartan-hydrochlorothiazide (HYZAAR) 50-12.5 MG tablet Take 1 tablet by mouth daily. 90 tablet 3   No current facility-administered medications for this visit.     Allergies:   Sulfa antibiotics    Social History:  The patient  reports that he has quit smoking. He smoked 0.25 packs per day. He uses smokeless tobacco. He reports that he drinks about 4.2 oz of alcohol per week. He reports that he does not use drugs.   Family History:  The patient's family history includes CAD in his father.    ROS:  Please see the history of present illness.   Otherwise, review of systems are positive for none.   All other systems are reviewed and  negative.    PHYSICAL EXAM: VS:  BP 140/78   Pulse 86   Ht 5\' 11"  (1.803 m)   Wt 222 lb (100.7 kg)   SpO2 97%   BMI 30.96 kg/m  , BMI Body mass index is 30.96 kg/m. Affect appropriate Healthy:  appears stated age 42: normal Neck supple with no adenopathy JVP normal no bruits no thyromegaly Lungs clear with no wheezing and good diaphragmatic motion Heart:  S1/S2 no murmur, no rub, gallop or click PMI normal Abdomen: benighn, BS positve, no tenderness, no AAA no bruit.  No HSM or HJR Distal pulses intact with no bruits No edema Neuro non-focal Skin warm and dry No muscular weakness    EKG:  02/13/15  SR rate 71 normal ECG  02/28/16  SR ate 71 normal  04/17/17 SR rate 81 normal    Recent Labs: No results found for requested labs within last 8760 hours.    Lipid Panel    Component Value Date/Time   CHOL 90 04/15/2015 0752   TRIG 85.0 04/15/2015 0752   HDL 30.10 (L) 04/15/2015 0752   CHOLHDL 3 04/15/2015 0752   VLDL 17.0 04/15/2015 0752   LDLCALC 43 04/15/2015 0752      Wt Readings from Last 3 Encounters:  09/16/17 222 lb (100.7 kg)  04/17/17 222 lb (100.7 kg)  09/19/16 229 lb 6.4 oz (104.1 kg)      Other studies Reviewed: Additional studies/ records that were reviewed today include: Epic notes recent hospitalization and notes primary Dr Lisbeth Ply.    ASSESSMENT AND PLAN:  1.Chest Pain: Moderate 3VD on medical RX  Has SL nitro tightest lesion mid circumflex only supplies small OM continue beta Blocker SL nitro ETT 04/25/17 normal   2. Smoking counseled on cessation less than 10 minutes   3. DM:  Discussed low carb diet Travel history makes good diet difficult  Lantus dose recently adjusted   To see Dr Buddy Duty endocrine soon   4. HTN:  Stop microzide and norvasc start Hyzaar 50/12/5 f/u labs primary      5. Chol:  Continue crestor labs with primary  Dose increased post cath 02/24/15   6. Edema:  Dependant HCTZ   Elevation and low sodium diet    F/U with  me in 36months   Signed, Jenkins Rouge, MD  09/16/2017 5:00 PM    [facility] [address], [place], [place]  [address] Phone: [phone]; Fax: [phone]

## 2017-09-16 ENCOUNTER — Ambulatory Visit: Payer: BC Managed Care – PPO | Admitting: Cardiovascular Disease

## 2017-09-16 ENCOUNTER — Encounter: Payer: Self-pay | Admitting: Cardiovascular Disease

## 2017-09-16 VITALS — BP 140/78 | HR 86 | Ht 71.0 in | Wt 222.0 lb

## 2017-09-16 DIAGNOSIS — R0600 Dyspnea, unspecified: Secondary | ICD-10-CM | POA: Diagnosis not present

## 2017-09-16 DIAGNOSIS — R079 Chest pain, unspecified: Secondary | ICD-10-CM

## 2017-09-16 DIAGNOSIS — I10 Essential (primary) hypertension: Secondary | ICD-10-CM

## 2017-09-16 DIAGNOSIS — E785 Hyperlipidemia, unspecified: Secondary | ICD-10-CM | POA: Diagnosis not present

## 2017-09-16 MED ORDER — LOSARTAN POTASSIUM-HCTZ 50-12.5 MG PO TABS: 1.0000 | ORAL_TABLET | Freq: Every day | ORAL | 3 refills | Status: DC

## 2017-09-16 NOTE — Patient Instructions (Addendum)
Medication Instructions:  Your physician has recommended you make the following change in your medication:  1-STOP Norvasc 2-STOP hydrochlorothiazide  3-START Hyzaar 50/12.5mg  by mouth daily   Labwork: NONE  Testing/Procedures: NONE  Follow-Up: Your physician wants you to follow-up in: 3 months with Dr. Johnsie Cancel.   If you need a refill on your cardiac medications before your next appointment, please call your pharmacy.

## 2017-12-13 NOTE — Progress Notes (Signed)
Cardiology Office Note   Date:  12/18/2017   ID:  Gregory Fox, Gregory Fox [birth year], MRN [id number]  PCP:  Patient, No Pcp Per  Cardiologist:   Jenkins Rouge, MD   No chief complaint on file.     History of Present Illness:  62 y.o. first seen for chest pain July 2016. Calcium score 1569 so diagnostic cath arranged With moderate 3 VD worse lesion in mid circumflex only supplies small OM  Myovue 03/04/15 non ischemic Medical Rx  Recommended. No angina and had ETT 04/25/17 also normal DM better controlled seeing Dr Buddy Duty  On 3 drug Rx for HTN     Cath Dr Tamala Julian 02/25/15  Moderate 3VD worst in circumflex distribution 60-80%   Conclusion    1. Prox Cx to Mid Cx lesion, 75% stenosed. 2. Mid LAD to Dist LAD lesion, 65% stenosed. 3. Ost LAD to Mid LAD lesion, 40% stenosed. 4. Mid RCA to Dist RCA lesion, 50% stenosed. 5. Dist RCA lesion, 65% stenosed. 6. Ost LM to LM lesion, 30% stenosed.   Proximal to mid circumflex in the 60-80% range appears angiographically significant but with recent stress Cardiolite demonstrating no evidence of ischemia.  Moderate LAD and right coronary disease.  Normal left ventricular function.   He is upset about his job. 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 GERD (gastroesophageal reflux disease)   . Gout   . Heart murmur   . Hypercholesteremia   . Hypertension   . Taste absent 01/24/12   "had wisdom tooth pulled; couple days later 2 crowns put in; can't taste since"  . Type II diabetes mellitus ([place])     Past Surgical History:  Procedure Laterality Date  . CARDIAC CATHETERIZATION N/A 02/25/2015   Procedure: Left Heart Cath and Coronary Angiography;  Surgeon: Belva Crome, MD;  Location: [facility];  Service: Cardiovascular;  Laterality: N/A;  . NO PAST SURGERIES       Current Outpatient Medications  Medication Sig Dispense Refill  . aspirin EC 81 MG  tablet Take 81 mg by mouth daily.    . carvedilol (COREG) 25 MG tablet Take 1 tablet (25 mg total) by mouth 2 (two) times daily. 180 tablet 3  . clopidogrel (PLAVIX) 75 MG tablet TAKE 1 TABLET (75 MG TOTAL) BY MOUTH DAILY. 30 tablet 5  . Dulaglutide (TRULICITY) 2.42 AS/3.4HD SOPN Inject 1 ampule into the skin once a week. Pt unsure of amount    . Insulin Glargine (BASAGLAR KWIKPEN) 100 UNIT/ML SOPN Inject 80 Units into the skin at bedtime.    Marland Kitchen KRILL OIL PO Take 1 tablet by mouth daily.    Marland Kitchen losartan-hydrochlorothiazide (HYZAAR) 50-12.5 MG tablet Take 1 tablet by mouth daily. 90 tablet 3  . metFORMIN (GLUCOPHAGE-XR) 500 MG 24 hr tablet Take 500 mg by mouth 3 (three) times daily.     . nitroGLYCERIN (NITROSTAT) 0.4 MG SL tablet PLACE 1 TABLET UNDER TONGUE EVERY 5 MINUTES AS NEEDED F0R CHEST PAIN (3 DOSES MAX) 25 tablet 3  . pantoprazole (PROTONIX) 40 MG tablet Take 1 tablet (40 mg total) by mouth daily. 30 tablet 11  . rosuvastatin (CRESTOR) 40 MG tablet TAKE 1 TABLET (40 MG TOTAL) BY MOUTH DAILY AT 6 PM. 30 tablet 11  . TRESIBA FLEXTOUCH 200 UNIT/ML SOPN Inject 40-60 mg as directed daily.    . TRULICITY 1.5 QQ/2.2LN SOPN  Inject 1.5 mg as directed once a week.     No current facility-administered medications for this visit.     Allergies:   Sulfa antibiotics    Social History:  The patient  reports that he has quit smoking. He smoked 0.25 packs per day. He uses smokeless tobacco. He reports that he drinks about 4.2 oz of alcohol per week. He reports that he does not use drugs.   Family History:  The patient's family history includes CAD in his father.    ROS:  Please see the history of present illness.   Otherwise, review of systems are positive for none.   All other systems are reviewed and negative.    PHYSICAL EXAM: VS:  BP (!) 150/86   Pulse 90   Ht 5\' 11"  (1.803 m)   Wt 209 lb 4 oz (94.9 kg)   SpO2 97%   BMI 29.18 kg/m  , BMI Body mass index is 29.18 kg/m. Affect  appropriate Healthy:  appears stated age 16: normal Neck supple with no adenopathy JVP normal no bruits no thyromegaly Lungs clear with no wheezing and good diaphragmatic motion Heart:  S1/S2 no murmur, no rub, gallop or click PMI normal Abdomen: benighn, BS positve, no tenderness, no AAA no bruit.  No HSM or HJR Distal pulses intact with no bruits No edema Neuro non-focal Skin warm and dry No muscular weakness    EKG:  02/13/15  SR rate 71 normal ECG  02/28/16  SR ate 71 normal  04/17/17 SR rate 81 normal    Recent Labs: No results found for requested labs within last 8760 hours.    Lipid Panel    Component Value Date/Time   CHOL 90 04/15/2015 0752   TRIG 85.0 04/15/2015 0752   HDL 30.10 (L) 04/15/2015 0752   CHOLHDL 3 04/15/2015 0752   VLDL 17.0 04/15/2015 0752   LDLCALC 43 04/15/2015 0752      Wt Readings from Last 3 Encounters:  12/18/17 209 lb 4 oz (94.9 kg)  09/16/17 222 lb (100.7 kg)  04/17/17 222 lb (100.7 kg)      Other studies Reviewed: Additional studies/ records that were reviewed today include: Epic notes recent hospitalization and notes primary Dr Lisbeth Ply.    ASSESSMENT AND PLAN:  1.Chest Pain: Moderate 3VD on medical RX Normal ETT 04/25/17   Tghtest lesion mid circumflex only supplies small OM continue beta Blocker SL nitro  F/U ETT October this year   2. Smoking counseled on cessation less than 10 minutes   3. DM:  Discussed low carb diet Travel history makes good diet difficult  Lantus dose per Dr Buddy Duty   4. HTN:  Better with ARB low sodium DASH diet  5. Chol:  Continue crestor labs with primary  Dose increased post cath 02/24/15   6. Edema:  Dependant improved with HCTZ    F/U with me in a year   Signed, Jenkins Rouge, MD  12/18/2017 8:18 AM    [facility], [place], [place]  [address] Phone: [phone]; Fax: [phone]

## 2017-12-18 ENCOUNTER — Encounter: Payer: Self-pay | Admitting: Cardiovascular Disease

## 2017-12-18 ENCOUNTER — Ambulatory Visit: Payer: BC Managed Care – PPO | Admitting: Cardiovascular Disease

## 2017-12-18 VITALS — BP 150/86 | HR 90 | Ht 71.0 in | Wt 209.2 lb

## 2017-12-18 DIAGNOSIS — E785 Hyperlipidemia, unspecified: Secondary | ICD-10-CM

## 2017-12-18 DIAGNOSIS — I10 Essential (primary) hypertension: Secondary | ICD-10-CM

## 2017-12-18 DIAGNOSIS — R079 Chest pain, unspecified: Secondary | ICD-10-CM

## 2017-12-18 NOTE — Patient Instructions (Addendum)
Medication Instructions:  Your physician recommends that you continue on your current medications as directed. Please refer to the Current Medication list given to you today.  Labwork: NONE  Testing/Procedures: Your physician has requested that you have an exercise tolerance test in October. For further information please visit [email or web address]. Please also follow instruction sheet, as given.  Follow-Up: Your physician wants you to follow-up in: 12 months with Dr. Johnsie Cancel. You will receive a reminder letter in the mail two months in advance. If you don't receive a letter, please call our office to schedule the follow-up appointment.   If you need a refill on your cardiac medications before your next appointment, please call your pharmacy.

## 2018-03-04 ENCOUNTER — Telehealth: Payer: Self-pay | Admitting: Cardiovascular Disease

## 2018-03-04 ENCOUNTER — Encounter: Payer: Self-pay | Admitting: Physician Assistant

## 2018-03-04 NOTE — Telephone Encounter (Signed)
Patient call transferred to triage. Patient states that last week he witnessed a death at work. He states that the last 3 days he has had intermittent episodes of left sided chest pain under his breast that he rates 4/10. He states that it is a dull ache and that it last for a few seconds. He denies radiation, SOB, lightheadedness, dizziness, N/V, palpitations, or any other Sx. He states that he took 1/2 NTG yesterday with no relief. He states that NTG has never really done anything for him. He states that his BP and HR have been fine but did not have any vitals to offer. Patient denies any pain at this time. Appointment made with Melina Copa, PA tomorrow morning at 8:30 AM. ER precautions reviewed with the patient. Patient verbalized understanding and thanked me for the call.

## 2018-03-04 NOTE — Progress Notes (Signed)
Cardiology Office Note    Date:  03/05/2018  ID:  Gregory Fox, DOB 10/18/1955, MRN [id number] PCP:  [facility]  Cardiologist:  Jenkins Rouge, MD   Chief Complaint: f/u chest pain  History of Present Illness:  Gregory Fox is a 62 y.o. male with history of moderate CAD, GERD, HTN, HLD, DM, dependent edema on HCTZ who presents for evaluation of chest pain. He has been followed for a history of such with prior abnormal cardiac CT prompting cath in 02/2015 showing 75% Prox-mid Cx, 65% mild-distal LAD, 40% ost-mid LAD, 50% mid-distal RCa, 65% distal RCA, 30% OM LM, normal LVEF - recent nuclear stress test had shown no ischemia. Medical therapy was recommended, with consideration of PCI for recurrent symptoms to Cx - Rotational atherectomy would be required. Plavix was added. 2D Echo 01/2015 showed EF 65-70%, grade 2 DD, mild LAE. ETT 04/2017 was normal. Planned surveillance with ETT 04/2018 was suggested at last OV 11/2017. No recent labs on file since 2016 when LDL 43, LFTS ok, CBC wnl, Cr 0.73.  He presents back for evaluation of chest pain. 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 On Thursday last week they had a workplace fatality and he was the one to find the coworker. He does report debriefing has been available to him to cope with incident. Since that time he's had intermittent CP 6-7 times a day. There are 2 types of pain, one of which is a sharp stabbing pain lasting less than a minute. The second type of pain is a dull achy discomfort under L breast area that persists about 20-30 minutes at a time. He hasn't had discomfort like this before. He has been awoken by the pain on an occasion. He has also possibly felt his left arm feels different at times. No focal neuro sx. He's noticed a little SOB associated with the discomfort, but no nausea, vomiting, diaphoresis. Symptoms are not typically provoked by anything in particular and ease off on their own  after deep breathing/calming down. He does walk 3-4 miles a day for his job and in general it has not always occurred with activity, but he did notice yesterday in particular it tended to happen moreso with activity. NTG doesn't really make a difference, it just eases off on its own with rest and calming down. He went on vacation recently with his wife who noticed he had increased DOE. He does report feeling exhausted but it's unclear if this is related to his work schedule. He does not currently have pain. He also notices some leg discomfort when he lays down at night. No claudication. Saw PCP yesterday - EKG normal. EKG today is also unrevealing.   Past Medical History:  Diagnosis Date  . CAD (coronary artery disease)    a.  cath in 02/2015 showing 75% Prox-mid Cx, 65% mild-distal LAD, 40% ost-mid LAD, 50% mid-distal RCa, 65% distal RCA, 30% OM LM, normal LVEF .  Marland Kitchen GERD (gastroesophageal reflux disease)   . Gout   . Heart murmur   . Hypercholesteremia   . Hypertension   . Taste absent 01/24/12   "had wisdom tooth pulled; couple days later 2 crowns put in; can't taste since"  . Type II diabetes mellitus ([place])     Past Surgical History:  Procedure Laterality Date  . CARDIAC CATHETERIZATION N/A 02/25/2015   Procedure: Left Heart Cath and Coronary Angiography;  Surgeon: Belva Crome, MD;  Location: [facility];  Service: Cardiovascular;  Laterality: N/A;  . NO PAST SURGERIES      Current Medications: Current Meds  Medication Sig  . ALPRAZolam (XANAX) 0.25 MG tablet Take 0.25 mg by mouth at bedtime as needed for anxiety.  Marland Kitchen aspirin EC 81 MG tablet Take 81 mg by mouth daily.  . carvedilol (COREG) 25 MG tablet Take 1 tablet (25 mg total) by mouth 2 (two) times daily.  . clopidogrel (PLAVIX) 75 MG tablet TAKE 1 TABLET (75 MG TOTAL) BY MOUTH DAILY.  . Dulaglutide (TRULICITY) 9.62 XB/2.8UX SOPN Inject 1 ampule into the skin once a week. Pt unsure of amount  . hydrochlorothiazide  (HYDRODIURIL) 12.5 MG tablet Take 12.5 mg by mouth as needed (swelling).   Marland Kitchen KRILL OIL PO Take 1 tablet by mouth daily.  Marland Kitchen losartan-hydrochlorothiazide (HYZAAR) 50-12.5 MG tablet Take 1 tablet by mouth daily.  . metFORMIN (GLUCOPHAGE-XR) 500 MG 24 hr tablet Take 500 mg by mouth 3 (three) times daily.   . nitroGLYCERIN (NITROSTAT) 0.4 MG SL tablet PLACE 1 TABLET UNDER TONGUE EVERY 5 MINUTES AS NEEDED F0R CHEST PAIN (3 DOSES MAX)  . pantoprazole (PROTONIX) 40 MG tablet Take 1 tablet (40 mg total) by mouth daily.  . rosuvastatin (CRESTOR) 40 MG tablet TAKE 1 TABLET (40 MG TOTAL) BY MOUTH DAILY AT 6 PM.  . TRESIBA FLEXTOUCH 200 UNIT/ML SOPN Inject 40-60 mg as directed daily.  . TRULICITY 1.5 LK/4.4WN SOPN Inject 1.5 mg as directed once a week.      Allergies:   Sulfa antibiotics   Social History   Socioeconomic History  . Marital status: Married    Spouse name: Not on file  . Number of children: Not on file  . Years of education: Not on file  . Highest education level: Not on file  Occupational History  . Not on file  Social Needs  . Financial resource strain: Not on file  . Food insecurity:    Worry: Not on file    Inability: Not on file  . Transportation needs:    Medical: Not on file    Non-medical: Not on file  Tobacco Use  . Smoking status: Former Smoker    Packs/day: 0.25  . Smokeless tobacco: Current User  Substance and Sexual Activity  . Alcohol use: Yes    Alcohol/week: 7.0 standard drinks    Types: 7 Cans of beer per week  . Drug use: No  . Sexual activity: Not on file  Lifestyle  . Physical activity:    Days per week: Not on file    Minutes per session: Not on file  . Stress: Not on file  Relationships  . Social connections:    Talks on phone: Not on file    Gets together: Not on file    Attends religious service: Not on file    Active member of club or organization: Not on file    Attends meetings of clubs or organizations: Not on file    Relationship  status: Not on file  Other Topics Concern  . Not on file  Social History Narrative  . Not on file     Family History:  The patient's family history includes CAD in his father.  ROS:   Please see the history of present illness.  All other systems are reviewed and otherwise negative.    PHYSICAL EXAM:   VS:  BP 120/78   Pulse 80   Ht 5\' 11"  (1.803 m)   Wt 213 lb 12.8  oz (97 kg)   BMI 29.82 kg/m   BMI: Body mass index is 29.82 kg/m. GEN: Well nourished, well developed WM, in no acute distress HEENT: normocephalic, atraumatic Neck: no JVD, carotid bruits, or masses Cardiac: RRR; no murmurs, rubs, or gallops, no edema, excellent pedal pulses bilaterally Respiratory:  clear to auscultation bilaterally, normal work of breathing GI: soft, nontender, nondistended, + BS MS: no deformity or atrophy Skin: warm and dry, no rash Neuro:  Alert and Oriented x 3, Strength and sensation are intact, follows commands Psych: euthymic mood, full affect  Wt Readings from Last 3 Encounters:  03/05/18 213 lb 12.8 oz (97 kg)  12/18/17 209 lb 4 oz (94.9 kg)  09/16/17 222 lb (100.7 kg)      Studies/Labs Reviewed:   EKG:  EKG was ordered today and personally reviewed by me and demonstrates NSR 80bpm, no acute changes Also brought in EKG from yesterday also NSR no acute changes.  Recent Labs: No results found for requested labs within last 8760 hours.   Lipid Panel    Component Value Date/Time   CHOL 90 04/15/2015 0752   TRIG 85.0 04/15/2015 0752   HDL 30.10 (L) 04/15/2015 0752   CHOLHDL 3 04/15/2015 0752   VLDL 17.0 04/15/2015 0752   LDLCALC 43 04/15/2015 0752    Additional studies/ records that were reviewed today include: Summarized above.    ASSESSMENT & PLAN:   1. Chest pain in setting of known CAD - mixed typical/atypical features, difficult to know if this is ischemic pain or not. I reviewed with Dr. Meda Coffee (DOD) today in clinic. Given multivessel disease 3 years ago and  symptoms, she would suggest outpatient cath and I agree this is needed for definitive assessment. Will also check basic labs to assess for any metabolic contribution. Risks and benefits of cardiac catheterization have been discussed with the patient.  These include bleeding, infection, kidney damage, stroke, heart attack, death.  The patient understands these risks and is willing to proceed. He was advised to take last dose of metformin tomorrow AM and no further after that. He was also advised to take 1/2 dose insulin the night before (takes 52 units QHS, so 26 units). He takes Trulicity once a week on Sundays so we are unable to modify that. No availability on cath schedule for tomorrow, so we have arranged for Friday. Add Imdur 30mg  in the interim. ER precautions reviewed with patient. Wrote work note to remain out of work until cleared by our team. They also request a note suggesting he not work more than 10-hour days as it is really taking a toll on his overall wellbeing. I think this is reasonable so included this in the note. 2. HTN - BP controlled. Follow. 3. HLD - no recent labs on file. Will get CMET and lipid profile in office today. 4. Leg pain- atypical for claudication. Good pulses. No pain with exertion. Will check lytes and go from there.  Disposition: F/u with Dr. Johnsie Cancel care team APP or myself post-cath.  Medication Adjustments/Labs and Tests Ordered: Current medicines are reviewed at length with the patient today.  Concerns regarding medicines are outlined above. Medication changes, Labs and Tests ordered today are summarized above and listed in the Patient Instructions accessible in Encounters.   Signed, Charlie Pitter, PA-C  03/05/2018 8:42 AM    [facility], [place], [place]  [address] Phone: [phone]; Fax: [phone]

## 2018-03-04 NOTE — Telephone Encounter (Signed)
New message   Pt c/o of Chest Pain: STAT if CP now or developed within 24 hours  1. Are you having CP right now?no   2. Are you experiencing any other symptoms (ex. SOB, nausea, vomiting, sweating)? Sob   3. How long have you been experiencing CP? For the last  2 days  4. Is your CP continuous or coming and going? Coming and going   5. Have you taken Nitroglycerin? yes ? Patient states that his chest pain has developed in the last 24 hours.
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
## 2018-03-05 ENCOUNTER — Encounter: Payer: Self-pay | Admitting: Physician Assistant

## 2018-03-05 ENCOUNTER — Encounter: Payer: Self-pay | Admitting: *Deleted

## 2018-03-05 ENCOUNTER — Telehealth: Payer: Self-pay | Admitting: Physician Assistant

## 2018-03-05 ENCOUNTER — Ambulatory Visit: Payer: BC Managed Care – PPO | Admitting: Physician Assistant

## 2018-03-05 VITALS — BP 120/78 | HR 80 | Ht 71.0 in | Wt 213.8 lb

## 2018-03-05 DIAGNOSIS — E785 Hyperlipidemia, unspecified: Secondary | ICD-10-CM

## 2018-03-05 DIAGNOSIS — I10 Essential (primary) hypertension: Secondary | ICD-10-CM

## 2018-03-05 DIAGNOSIS — R079 Chest pain, unspecified: Secondary | ICD-10-CM

## 2018-03-05 DIAGNOSIS — I2510 Atherosclerotic heart disease of native coronary artery without angina pectoris: Secondary | ICD-10-CM | POA: Diagnosis not present

## 2018-03-05 DIAGNOSIS — M79605 Pain in left leg: Secondary | ICD-10-CM

## 2018-03-05 DIAGNOSIS — M79604 Pain in right leg: Secondary | ICD-10-CM

## 2018-03-05 LAB — COMPREHENSIVE METABOLIC PANEL
ALBUMIN: 4.8 g/dL (ref 3.6–4.8)
ALK PHOS: 37 IU/L — AB (ref 39–117)
ALT: 42 IU/L (ref 0–44)
AST: 25 IU/L (ref 0–40)
Albumin/Globulin Ratio: 2.3 — ABNORMAL HIGH (ref 1.2–2.2)
BILIRUBIN TOTAL: 0.7 mg/dL (ref 0.0–1.2)
BUN/Creatinine Ratio: 16 (ref 10–24)
BUN: 12 mg/dL (ref 8–27)
CHLORIDE: 98 mmol/L (ref 96–106)
CO2: 24 mmol/L (ref 20–29)
Calcium: 9.6 mg/dL (ref 8.6–10.2)
Creatinine, Ser: 0.74 mg/dL — ABNORMAL LOW (ref 0.76–1.27)
GFR calc Af Amer: 113 mL/min/{1.73_m2} (ref >59)
GFR calc non Af Amer: 97 mL/min/{1.73_m2} (ref >59)
GLOBULIN, TOTAL: 2.1 g/dL (ref 1.5–4.5)
Glucose: 133 mg/dL — ABNORMAL HIGH (ref 65–99)
Potassium: 4 mmol/L (ref 3.5–5.2)
SODIUM: 137 mmol/L (ref 134–144)
Total Protein: 6.9 g/dL (ref 6.0–8.5)

## 2018-03-05 LAB — CBC
HEMATOCRIT: 45.3 % (ref 37.5–51.0)
HEMOGLOBIN: 15.7 g/dL (ref 13.0–17.7)
MCH: 30.3 pg (ref 26.6–33.0)
MCHC: 34.7 g/dL (ref 31.5–35.7)
MCV: 88 fL (ref 79–97)
Platelets: 191 10*3/uL (ref 150–450)
RBC: 5.18 x10E6/uL (ref 4.14–5.80)
RDW: 13.1 % (ref 12.3–15.4)
WBC: 5.8 10*3/uL (ref 3.4–10.8)

## 2018-03-05 LAB — LIPID PANEL
Chol/HDL Ratio: 2.4 ratio (ref 0.0–5.0)
Cholesterol, Total: 69 mg/dL — ABNORMAL LOW (ref 100–199)
HDL: 29 mg/dL — AB (ref >39)
LDL Calculated: 22 mg/dL (ref 0–99)
TRIGLYCERIDES: 91 mg/dL (ref 0–149)
VLDL CHOLESTEROL CAL: 18 mg/dL (ref 5–40)

## 2018-03-05 LAB — TSH: TSH: 1.48 u[IU]/mL (ref 0.450–4.500)

## 2018-03-05 LAB — MAGNESIUM: MAGNESIUM: 1.7 mg/dL (ref 1.6–2.3)

## 2018-03-05 MED ORDER — ISOSORBIDE MONONITRATE ER 30 MG PO TB24: 30.0000 mg | ORAL_TABLET | Freq: Every day | ORAL | 1 refills | Status: DC

## 2018-03-05 NOTE — Telephone Encounter (Signed)
New message   Patient went to pharmacy to pickup prescription for IMDUR 30 mg once daily. The pharmacy states that it has not been sent to them. The pharmacy is [facility]. The phone for the pharmacy is [phone].

## 2018-03-05 NOTE — Telephone Encounter (Signed)
Spoke with patient, his IMDUR 30 mg was called in to [facility] and the [facility] [place]. I called the [facility] and canceled the order and [facility]. Patient had no further questions.

## 2018-03-05 NOTE — Patient Instructions (Addendum)
Medication Instructions:   START TAKING IMDUR 30 MG ONCE A DAY   If you need a refill on your cardiac medications before your next appointment, please call your pharmacy.  Labwork: CMET MAG CBC TSH LIPIDS    Testing/Procedures: Your physician has requested that you have a cardiac catheterization. Cardiac catheterization is used to diagnose and/or treat various heart conditions. Doctors may recommend this procedure for a number of different reasons. The most common reason is to evaluate chest pain. Chest pain can be a symptom of coronary artery disease (CAD), and cardiac catheterization can show whether plaque is narrowing or blocking your heart's arteries. This procedure is also used to evaluate the valves, as well as measure the blood flow and oxygen levels in different parts of your heart. For further information please visit HugeFiesta.tn. Please follow instruction sheet, as given.     Follow-Up: 2 WEEKS POST CATH WITH APP ON NISHAN TEAM OR DUNN   Any Other Special Instructions Will Be Listed Below (If Applicable).     [facility] [place], [place] [address] Dept: [phone] Loc: [phone]  PEPE MINEAU  03/05/2018  You are scheduled for a Cardiac Catheterization on Friday, August 16  with Dr. Sherren Mocha.  1. Please arrive at the [facility] (Main Entrance A) at [facility]: [address] [place], [place] [address] at 5:30 AM (This time is two hours before your procedure to ensure your preparation). Free valet parking service is available.   Special note: Every effort is made to have your procedure done on time. Please understand that emergencies sometimes delay scheduled procedures.  2. Diet: Do not eat solid foods after midnight.  The patient may have clear liquids until 5am upon the day of the procedure.  3. Labs: You will need to have blood  drawn on Wednesday, August 14 at [facility] at [facility]. [address] Open: 7:30am - 5pm    Phone: [phone]. You do not need to be fasting.  4. Medication instructions in preparation for your procedure:   Contrast Allergy: No  Take only 26 units of insulin the night before your procedure. Do not take any insulin on the day of the procedure.  Stop Taking PO Diabetes Meds Glucophage (Metformin)on Thursday, August 15. Resume 48 hours after.  On the morning of your procedure, take your Aspirin and Plavix and any morning medicines NOT listed above.  You may use sips of water.  5. Plan for one night stay--bring personal belongings. 6. Bring a current list of your medications and current insurance cards. 7. You MUST have a responsible person to drive you home. 8. Someone MUST be with you the first 24 hours after you arrive home or your discharge will be delayed. 9. Please wear clothes that are easy to get on and off and wear slip-on shoes.  Thank you for allowing [place] to care for you!   --  Invasive Cardiovascular services

## 2018-03-06 ENCOUNTER — Telehealth: Payer: Self-pay | Admitting: *Deleted

## 2018-03-06 NOTE — Telephone Encounter (Signed)
Pt contacted pre-catheterization scheduled at [facility] for: Friday March 07, 2018 7:30 AM Verified arrival time and place: [facility] Entrance A at: 5:30 AM  No solid food after midnight prior to cath, clear liquids until 5 AM day of procedure. Verified allergies in Epic  Hold: Losartan-HCT AM of procedure Metformin -day of procedure and 48 hours post procedure. Tresiba-1/2 PM dose-PM prior to procedure.  HCTZ -AM of procedure  Except hold medications AM meds can be  taken pre-cath with sip of water including: AS 81 mg Clopidogrel 75 mg  Confirmed patient has responsible person to drive home post procedure and for 24 hours after you arrive home: yes

## 2018-03-07 ENCOUNTER — Encounter (HOSPITAL_COMMUNITY): Payer: Self-pay | Admitting: Cardiovascular Disease

## 2018-03-07 ENCOUNTER — Ambulatory Visit (HOSPITAL_COMMUNITY)
Admission: RE | Admit: 2018-03-07 | Discharge: 2018-03-07 | Disposition: A | Discharge status: Home / Self Care | Payer: BC Managed Care – PPO | Source: Ambulatory Visit | Attending: Cardiovascular Disease | Admitting: Cardiovascular Disease

## 2018-03-07 ENCOUNTER — Encounter (HOSPITAL_COMMUNITY)
Admission: RE | Disposition: A | Discharge status: Home / Self Care | Payer: Self-pay | Source: Ambulatory Visit | Attending: Cardiovascular Disease

## 2018-03-07 DIAGNOSIS — R079 Chest pain, unspecified: Secondary | ICD-10-CM

## 2018-03-07 DIAGNOSIS — E119 Type 2 diabetes mellitus without complications: Secondary | ICD-10-CM | POA: Insufficient documentation

## 2018-03-07 DIAGNOSIS — I10 Essential (primary) hypertension: Secondary | ICD-10-CM | POA: Diagnosis not present

## 2018-03-07 DIAGNOSIS — Z7901 Long term (current) use of anticoagulants: Secondary | ICD-10-CM | POA: Insufficient documentation

## 2018-03-07 DIAGNOSIS — K219 Gastro-esophageal reflux disease without esophagitis: Secondary | ICD-10-CM | POA: Insufficient documentation

## 2018-03-07 DIAGNOSIS — I2584 Coronary atherosclerosis due to calcified coronary lesion: Secondary | ICD-10-CM | POA: Insufficient documentation

## 2018-03-07 DIAGNOSIS — Z955 Presence of coronary angioplasty implant and graft: Secondary | ICD-10-CM | POA: Insufficient documentation

## 2018-03-07 DIAGNOSIS — Z7982 Long term (current) use of aspirin: Secondary | ICD-10-CM | POA: Insufficient documentation

## 2018-03-07 DIAGNOSIS — Z882 Allergy status to sulfonamides status: Secondary | ICD-10-CM | POA: Diagnosis not present

## 2018-03-07 DIAGNOSIS — Z794 Long term (current) use of insulin: Secondary | ICD-10-CM | POA: Insufficient documentation

## 2018-03-07 DIAGNOSIS — M109 Gout, unspecified: Secondary | ICD-10-CM | POA: Diagnosis not present

## 2018-03-07 DIAGNOSIS — I25119 Atherosclerotic heart disease of native coronary artery with unspecified angina pectoris: Secondary | ICD-10-CM | POA: Diagnosis not present

## 2018-03-07 DIAGNOSIS — E7800 Pure hypercholesterolemia, unspecified: Secondary | ICD-10-CM | POA: Insufficient documentation

## 2018-03-07 DIAGNOSIS — Z87891 Personal history of nicotine dependence: Secondary | ICD-10-CM | POA: Diagnosis not present

## 2018-03-07 DIAGNOSIS — Z8249 Family history of ischemic heart disease and other diseases of the circulatory system: Secondary | ICD-10-CM | POA: Insufficient documentation

## 2018-03-07 DIAGNOSIS — Z79899 Other long term (current) drug therapy: Secondary | ICD-10-CM | POA: Insufficient documentation

## 2018-03-07 DIAGNOSIS — I2510 Atherosclerotic heart disease of native coronary artery without angina pectoris: Secondary | ICD-10-CM | POA: Diagnosis present

## 2018-03-07 HISTORY — PX: LEFT HEART CATH AND CORONARY ANGIOGRAPHY: CATH118249

## 2018-03-07 LAB — GLUCOSE, CAPILLARY: Glucose-Capillary: 125 mg/dL — ABNORMAL HIGH (ref 70–99)

## 2018-03-07 SURGERY — LEFT HEART CATH AND CORONARY ANGIOGRAPHY
Anesthesia: LOCAL

## 2018-03-07 MED ORDER — VERAPAMIL HCL 2.5 MG/ML IV SOLN
INTRAVENOUS | Status: AC
  Filled 2018-03-07: qty 2

## 2018-03-07 MED ORDER — SODIUM CHLORIDE 0.9 % WEIGHT BASED INFUSION
3.0000 mL/kg/h | INTRAVENOUS | Status: AC
  Administered 2018-03-07: 3 mL/kg/h via INTRAVENOUS

## 2018-03-07 MED ORDER — HEPARIN (PORCINE) IN NACL 1000-0.9 UT/500ML-% IV SOLN
INTRAVENOUS | Status: DC | PRN
  Administered 2018-03-07 (×2): 500 mL

## 2018-03-07 MED ORDER — SODIUM CHLORIDE 0.9% FLUSH: 3.0000 mL | Freq: Two times a day (BID) | INTRAVENOUS | Status: DC

## 2018-03-07 MED ORDER — SODIUM CHLORIDE 0.9 % IV SOLN: 250.0000 mL | INTRAVENOUS | Status: DC | PRN

## 2018-03-07 MED ORDER — LIDOCAINE HCL (PF) 1 % IJ SOLN
INTRAMUSCULAR | Status: AC
  Filled 2018-03-07: qty 30

## 2018-03-07 MED ORDER — MIDAZOLAM HCL 2 MG/2ML IJ SOLN
INTRAMUSCULAR | Status: DC | PRN
  Administered 2018-03-07: 2 mg via INTRAVENOUS

## 2018-03-07 MED ORDER — IOPAMIDOL (ISOVUE-370) INJECTION 76%
INTRAVENOUS | Status: AC
  Filled 2018-03-07: qty 125

## 2018-03-07 MED ORDER — VERAPAMIL HCL 2.5 MG/ML IV SOLN
INTRAVENOUS | Status: DC | PRN
  Administered 2018-03-07: 10 mL via INTRA_ARTERIAL

## 2018-03-07 MED ORDER — SODIUM CHLORIDE 0.9% FLUSH: 3.0000 mL | INTRAVENOUS | Status: DC | PRN

## 2018-03-07 MED ORDER — FENTANYL CITRATE (PF) 100 MCG/2ML IJ SOLN
INTRAMUSCULAR | Status: AC
  Filled 2018-03-07: qty 2

## 2018-03-07 MED ORDER — HEPARIN SODIUM (PORCINE) 1000 UNIT/ML IJ SOLN
INTRAMUSCULAR | Status: DC | PRN
  Administered 2018-03-07: 5000 [IU] via INTRAVENOUS

## 2018-03-07 MED ORDER — ASPIRIN 81 MG PO CHEW: 81.0000 mg | CHEWABLE_TABLET | ORAL | Status: DC

## 2018-03-07 MED ORDER — HEPARIN SODIUM (PORCINE) 1000 UNIT/ML IJ SOLN
INTRAMUSCULAR | Status: AC
  Filled 2018-03-07: qty 1

## 2018-03-07 MED ORDER — FENTANYL CITRATE (PF) 100 MCG/2ML IJ SOLN
INTRAMUSCULAR | Status: DC | PRN
  Administered 2018-03-07: 25 ug via INTRAVENOUS

## 2018-03-07 MED ORDER — MIDAZOLAM HCL 2 MG/2ML IJ SOLN
INTRAMUSCULAR | Status: AC
Start: 2018-03-07 — End: ?
  Filled 2018-03-07: qty 2

## 2018-03-07 MED ORDER — SODIUM CHLORIDE 0.9 % WEIGHT BASED INFUSION: 1.0000 mL/kg/h | INTRAVENOUS | Status: DC

## 2018-03-07 MED ORDER — LIDOCAINE HCL (PF) 1 % IJ SOLN
INTRAMUSCULAR | Status: DC | PRN
  Administered 2018-03-07: 1 mL

## 2018-03-07 MED ORDER — ACETAMINOPHEN 325 MG PO TABS: 650.0000 mg | ORAL_TABLET | ORAL | Status: DC | PRN

## 2018-03-07 MED ORDER — ONDANSETRON HCL 4 MG/2ML IJ SOLN: 4.0000 mg | Freq: Four times a day (QID) | INTRAMUSCULAR | Status: DC | PRN

## 2018-03-07 MED ORDER — IOPAMIDOL (ISOVUE-370) INJECTION 76%
INTRAVENOUS | Status: DC | PRN
  Administered 2018-03-07: 85 mL via INTRAVENOUS

## 2018-03-07 MED ORDER — HEPARIN (PORCINE) IN NACL 1000-0.9 UT/500ML-% IV SOLN
INTRAVENOUS | Status: AC
  Filled 2018-03-07: qty 1000

## 2018-03-07 SURGICAL SUPPLY — 10 items
CATH INFINITI JR4 5F (CATHETERS) ×2 IMPLANT
CATH LAUNCHER 6FR EBU3.5 (CATHETERS) ×2 IMPLANT
DEVICE RAD COMP TR BAND LRG (VASCULAR PRODUCTS) ×2 IMPLANT
GLIDESHEATH SLEND SS 6F .021 (SHEATH) ×2 IMPLANT
GUIDEWIRE INQWIRE 1.5J.035X260 (WIRE) ×1 IMPLANT
INQWIRE 1.5J .035X260CM (WIRE) ×2
KIT HEART LEFT (KITS) ×2 IMPLANT
PACK CARDIAC CATHETERIZATION (CUSTOM PROCEDURE TRAY) ×2 IMPLANT
TRANSDUCER W/STOPCOCK (MISCELLANEOUS) ×2 IMPLANT
TUBING CIL FLEX 10 FLL-RA (TUBING) ×2 IMPLANT

## 2018-03-07 NOTE — Discharge Instructions (Signed)
**Note Gregory Fox-identified via Obfuscation** Radial Site Care °Refer to this sheet in the next few weeks. These instructions provide you with information about caring for yourself after your procedure. Your health care provider may also give you more specific instructions. Your treatment has been planned according to current medical practices, but problems sometimes occur. Call your health care provider if you have any problems or questions after your procedure. °What can I expect after the procedure? °After your procedure, it is typical to have the following: °· Bruising at the radial site that usually fades within 1-2 weeks. °· Blood collecting in the tissue (hematoma) that may be painful to the touch. It should usually decrease in size and tenderness within 1-2 weeks. ° °Follow these instructions at home: °· Take medicines only as directed by your health care provider. °· You may shower 24-48 hours after the procedure or as directed by your health care provider. Remove the bandage (dressing) and gently wash the site with plain soap and water. Pat the area dry with a clean towel. Do not rub the site, because this may cause bleeding. °· Do not take baths, swim, or use a hot tub until your health care provider approves. °· Check your insertion site every day for redness, swelling, or drainage. °· Do not apply powder or lotion to the site. °· Do not flex or bend the affected arm for 24 hours or as directed by your health care provider. °· Do not push or pull heavy objects with the affected arm for 24 hours or as directed by your health care provider. °· Do not lift over 10 lb (4.5 kg) for 5 days after your procedure or as directed by your health care provider. °· Ask your health care provider when it is okay to: °? Return to work or school. °? Resume usual physical activities or sports. °? Resume sexual activity. °· Do not drive home if you are discharged the same day as the procedure. Have someone else drive you. °· You may drive 24 hours after the procedure  unless otherwise instructed by your health care provider. °· Do not operate machinery or power tools for 24 hours after the procedure. °· If your procedure was done as an outpatient procedure, which means that you went home the same day as your procedure, a responsible adult should be with you for the first 24 hours after you arrive home. °· Keep all follow-up visits as directed by your health care provider. This is important. °Contact a health care provider if: °· You have a fever. °· You have chills. °· You have increased bleeding from the radial site. Hold pressure on the site. °Get help right away if: °· You have unusual pain at the radial site. °· You have redness, warmth, or swelling at the radial site. °· You have drainage (other than a small amount of blood on the dressing) from the radial site. °· The radial site is bleeding, and the bleeding does not stop after 30 minutes of holding steady pressure on the site. °· Your arm or hand becomes pale, cool, tingly, or numb. °This information is not intended to replace advice given to you by your health care provider. Make sure you discuss any questions you have with your health care provider. °Document Released: 08/11/2010 Document Revised: 12/15/2015 Document Reviewed: 01/25/2014 °Elsevier Interactive Patient Education © 2018 [employer] ° °

## 2018-03-07 NOTE — Interval H&P Note (Signed)
Cath Lab Visit (complete for each Cath Lab visit)  Clinical Evaluation Leading to the Procedure:   ACS: No.  Non-ACS:    Anginal Classification: CCS III  Anti-ischemic medical therapy: Minimal Therapy (1 class of medications)  Non-Invasive Test Results: No non-invasive testing performed  Prior CABG: No previous CABG      History and Physical Interval Note:  03/07/2018 7:44 AM  Gregory Fox  has presented today for surgery, with the diagnosis of cp  The various methods of treatment have been discussed with the patient and family. After consideration of risks, benefits and other options for treatment, the patient has consented to  Procedure(s): LEFT HEART CATH AND CORONARY ANGIOGRAPHY (N/A) as a surgical intervention .  The patient's history has been reviewed, patient examined, no change in status, stable for surgery.  I have reviewed the patient's chart and labs.  Questions were answered to the patient's satisfaction.     Sherren Mocha

## 2018-03-21 ENCOUNTER — Encounter: Payer: Self-pay | Admitting: Physician Assistant

## 2018-03-24 ENCOUNTER — Encounter: Payer: Self-pay | Admitting: Physician Assistant

## 2018-03-24 NOTE — Progress Notes (Signed)
Cardiology Office Note    Date:  03/25/2018  ID:  Gregory Fox, DOB 06-24-1956, MRN [id number] PCP:  [facility]  Cardiologist:  Jenkins Rouge, MD   Chief Complaint: f/u cath  History of Present Illness:  Gregory Fox is a 62 y.o. male with history of moderate CAD, GERD, HTN, HLD, DM, dependent edema on HCTZ who presents for for post-hospital follow-up. He has been followed for a history of chest pain with prior abnormal cardiac CT prompting cath in 02/2015 showing 75% Prox-mid Cx, otherwise moderate nonobstructive disease, normal LVEF - recent nuclear stress test had shown no ischemia. Medical therapy was recommended, with consideration of PCI for recurrent symptoms to Cx. Plavix was added. 2D Echo 01/2015 showed EF 65-70%, grade 2 DD, mild LAE. ETT 04/2017 was normal. He recently returned to clinic with chest pain. He had recently found a coworker fatality on the job which had caused a lot of anxiety. Imdur was added and cath was arranged. This was done 03/07/18 showing 30% ostial LM, 40% mid-distal LAD, 40% ostial-mid LAD, 75% prox-mid Cx, 40% mid-distal RCA, 65% distal RCA without change from 2016 study, normal LVEDP. Continued medical therapy was recommended. Most recent labs 02/2018 showed LDL 22, TSH, Mg 1.7, CBC wnl, glucose 133, Cr 0.74, LFTs ok. MagOx was offered for occasional leg cramps at end of long workday, but patient declined.  He returns for follow-up today feeling overall better in terms of recent chest pain. He still has bouts of anxiety and is using medication periodically for this. He has to walk by the scene every day. His edema appears well controlled (not present on exam today). No SOB, orthopnea, palpitations, syncope reported. He picked up some OTC magnesium at [facility] and feels as though it is helping his leg cramps.  Past Medical History:  Diagnosis Date  . CAD (coronary artery disease)    a.  cath in 02/2015 showing 75% Prox-mid Cx, otherwise  nonobstructive disease. b. Cath 02/2018 stable from prior -> med rx.  Marland Kitchen GERD (gastroesophageal reflux disease)   . Gout   . Heart murmur   . Hypercholesteremia   . Hypertension   . Taste absent 01/24/12   "had wisdom tooth pulled; couple days later 2 crowns put in; can't taste since"  . Type II diabetes mellitus ([place])     Past Surgical History:  Procedure Laterality Date  . CARDIAC CATHETERIZATION N/A 02/25/2015   Procedure: Left Heart Cath and Coronary Angiography;  Surgeon: Belva Crome, MD;  Location: [place] [facility];  Service: Cardiovascular;  Laterality: N/A;  . LEFT HEART CATH AND CORONARY ANGIOGRAPHY N/A 03/07/2018   Procedure: LEFT HEART CATH AND CORONARY ANGIOGRAPHY;  Surgeon: Sherren Mocha, MD;  Location: [place] [facility];  Service: Cardiovascular;  Laterality: N/A;  . NO PAST SURGERIES      Current Medications: Current Meds  Medication Sig  . ALPRAZolam (XANAX) 0.25 MG tablet Take 0.125-0.25 mg by mouth 2 (two) times daily as needed for anxiety.   Marland Kitchen aspirin EC 81 MG tablet Take 81 mg by mouth daily.  . carvedilol (COREG) 25 MG tablet Take 1 tablet (25 mg total) by mouth 2 (two) times daily.  . clopidogrel (PLAVIX) 75 MG tablet TAKE 1 TABLET (75 MG TOTAL) BY MOUTH DAILY.  . hydrochlorothiazide (HYDRODIURIL) 12.5 MG tablet Take 12.5 mg by mouth as needed (swelling).   . isosorbide mononitrate (IMDUR) 30 MG 24 hr tablet Take 1 tablet (30 mg total) by  mouth daily.  Marland Kitchen KRILL OIL PO Take 1 tablet by mouth daily.  Marland Kitchen losartan-hydrochlorothiazide (HYZAAR) 50-12.5 MG tablet Take 1 tablet by mouth daily.  . metFORMIN (GLUCOPHAGE-XR) 500 MG 24 hr tablet Take 1,500 mg by mouth every evening.   . naproxen sodium (ALEVE) 220 MG tablet Take 220 mg by mouth daily as needed (pain).  . nitroGLYCERIN (NITROSTAT) 0.4 MG SL tablet PLACE 1 TABLET UNDER TONGUE EVERY 5 MINUTES AS NEEDED F0R CHEST PAIN (3 DOSES MAX)  . pantoprazole (PROTONIX) 40 MG tablet Take 1 tablet (40 mg total) by mouth  daily.  . rosuvastatin (CRESTOR) 40 MG tablet TAKE 1 TABLET (40 MG TOTAL) BY MOUTH DAILY AT 6 PM.  . TRESIBA FLEXTOUCH 200 UNIT/ML SOPN Inject 40-60 mg as directed at bedtime.   . TRULICITY 1.5 VC/9.4WH SOPN Inject 1.5 mg as directed every Sunday.       Allergies:   Sulfa antibiotics   Social History   Socioeconomic History  . Marital status: Married    Spouse name: Not on file  . Number of children: Not on file  . Years of education: Not on file  . Highest education level: Not on file  Occupational History  . Not on file  Social Needs  . Financial resource strain: Not on file  . Food insecurity:    Worry: Not on file    Inability: Not on file  . Transportation needs:    Medical: Not on file    Non-medical: Not on file  Tobacco Use  . Smoking status: Former Smoker    Packs/day: 0.25  . Smokeless tobacco: Current User  Substance and Sexual Activity  . Alcohol use: Yes    Alcohol/week: 7.0 standard drinks    Types: 7 Cans of beer per week  . Drug use: No  . Sexual activity: Not on file  Lifestyle  . Physical activity:    Days per week: Not on file    Minutes per session: Not on file  . Stress: Not on file  Relationships  . Social connections:    Talks on phone: Not on file    Gets together: Not on file    Attends religious service: Not on file    Active member of club or organization: Not on file    Attends meetings of clubs or organizations: Not on file    Relationship status: Not on file  Other Topics Concern  . Not on file  Social History Narrative  . Not on file     Family History:  The patient's family history includes CAD in his father.  ROS:   Please see the history of present illness. No SI/HI demonstrated. All other systems are reviewed and otherwise negative.    PHYSICAL EXAM:   VS:  BP 132/76   Pulse 75   Ht 5\' 11"  (1.803 m)   Wt 219 lb 12.8 oz (99.7 kg)   SpO2 98%   BMI 30.66 kg/m   BMI: Body mass index is 30.66 kg/m. GEN: Well  nourished, well developed WM, in no acute distress HEENT: normocephalic, atraumatic Neck: no JVD, carotid bruits, or masses Cardiac: RRR; no murmurs, rubs, or gallops, no edema  Respiratory:  clear to auscultation bilaterally, normal work of breathing GI: soft, nontender, nondistended, + BS MS: no deformity or atrophy Skin: warm and dry, no rash. Right radial cath site without hematoma or ecchymosis; good pulse. (Old cath site has chronic nodule at former injection site). Neuro:  Alert and Oriented  x 3, Strength and sensation are intact, follows commands Psych: euthymic mood, full affect  Wt Readings from Last 3 Encounters:  03/25/18 219 lb 12.8 oz (99.7 kg)  03/07/18 213 lb (96.6 kg)  03/05/18 213 lb 12.8 oz (97 kg)      Studies/Labs Reviewed:   EKG:  EKG was not ordered today  Recent Labs: 03/05/2018: ALT 42; BUN 12; Creatinine, Ser 0.74; Hemoglobin 15.7; Magnesium 1.7; Platelets 191; Potassium 4.0; Sodium 137; TSH 1.480   Lipid Panel    Component Value Date/Time   CHOL 69 (L) 03/05/2018 0948   TRIG 91 03/05/2018 0948   HDL 29 (L) 03/05/2018 0948   CHOLHDL 2.4 03/05/2018 0948   CHOLHDL 3 04/15/2015 0752   VLDL 17.0 04/15/2015 0752   LDLCALC 22 03/05/2018 0948    Additional studies/ records that were reviewed today include: Summarized above.   ASSESSMENT & PLAN:   1. Chest pain - suspect driven by anxiety of recent workplace event. This has improved. Cath stable. OK to continue Imdur for now given residual disease. I gave him the name of Apolonio Schneiders with behavioral health counseling as several colleagues in the area have recommended him for counseling to help him work through his trauma. 2. CAD - stable as above. Continue current regimen. Does not need ETT in 04/2017 since he just had cath - will cancel. Will have him see Dr. Johnsie Cancel back in 4 months to decide timing of next surveillance testing. 3. Myalgias - improved with OTC magnesium (Mg 1.7). I told him to let us  know if he wants to send this in as an rx for MagOx 400mg  daily and we will do so. 4. HTN - controlled. 5. Hyperlipidemia - continue present regimen. If leg cramping does not continue to improve, consideration could be given to decreasing his statin to see if that helps.  Disposition: F/u with Dr. Johnsie Cancel in 07/2017.   Medication Adjustments/Labs and Tests Ordered: Current medicines are reviewed at length with the patient today.  Concerns regarding medicines are outlined above. Medication changes, Labs and Tests ordered today are summarized above and listed in the Patient Instructions accessible in Encounters.   Signed, Charlie Pitter, PA-C  03/25/2018 10:22 AM    [address], [place], [place]  [address] Phone: [phone]; Fax: [phone]

## 2018-03-25 ENCOUNTER — Encounter: Payer: Self-pay | Admitting: Physician Assistant

## 2018-03-25 ENCOUNTER — Ambulatory Visit: Payer: BC Managed Care – PPO | Admitting: Physician Assistant

## 2018-03-25 VITALS — BP 132/76 | HR 75 | Ht 71.0 in | Wt 219.8 lb

## 2018-03-25 DIAGNOSIS — E785 Hyperlipidemia, unspecified: Secondary | ICD-10-CM

## 2018-03-25 DIAGNOSIS — I2510 Atherosclerotic heart disease of native coronary artery without angina pectoris: Secondary | ICD-10-CM | POA: Diagnosis not present

## 2018-03-25 DIAGNOSIS — I10 Essential (primary) hypertension: Secondary | ICD-10-CM | POA: Diagnosis not present

## 2018-03-25 DIAGNOSIS — R079 Chest pain, unspecified: Secondary | ICD-10-CM

## 2018-03-25 DIAGNOSIS — M791 Myalgia, unspecified site: Secondary | ICD-10-CM | POA: Diagnosis not present

## 2018-03-25 MED ORDER — LOSARTAN POTASSIUM-HCTZ 50-12.5 MG PO TABS: 1.0000 | ORAL_TABLET | Freq: Every day | ORAL | 3 refills | Status: DC

## 2018-03-25 NOTE — Patient Instructions (Addendum)
Medication Instructions:  Your physician recommends that you continue on your current medications as directed. Please refer to the Current Medication list given to you today.   Labwork: None ordered  Testing/Procedures: None ordered  Follow-Up: Your physician recommends that you schedule a follow-up appointment in: January 2020 [place] Johnsie Cancel.  THEY WILL SEND YOU A LETTER WHEN HIS SCHEDULE IS OPEN AND YOU CAN CALL AND MAKE THAT APPT.   Any Other Special Instructions Will Be Listed Below (If Applicable).  Please consider giving Apolonio Schneiders PhD a call at the [facility] to work through your recent trauma. His office number is [phone].  If you decide you want [place] to send in a prescription for magnesium oxide 400mg  daily, don't hesitate to let us know.  It has been a privilege getting to take care of you and I wish you the best.   If you need a refill on your cardiac medications before your next appointment, please call your pharmacy.

## 2018-05-26 ENCOUNTER — Other Ambulatory Visit: Payer: Self-pay | Admitting: Physician Assistant

## 2018-06-08 ENCOUNTER — Other Ambulatory Visit: Payer: Self-pay | Admitting: Cardiovascular Disease

## 2018-07-21 NOTE — Progress Notes (Signed)
Cardiology Office Note    Date:  07/28/2018  ID:  Gregory Fox, Gregory Fox 1955/12/30, MRN [id number] PCP:  Philmore Pali, NP  Cardiologist:  Jenkins Rouge, MD   Chief Complaint: f/u cath  History of Present Illness:   62 y.o. with CAD, HTN, HLD, DM Last seen by PA in September. He was hospitalized for chest pain. Had traumatic event at work with a co worker dying. Known moderate CAD since 2017 Reviewed cath films from Dr Burt Knack 03/07/18 and disease unchanged with 75% proximal to mid circumflex and 65% distal RCA. Imdur added. Had a normal ETT 05/21/2017 Anxiety regarding the death and referred to Dr Cheryln Manly    ith history of moderate CAD, GERD, HTN, HLD, DM, dependent edema on HCTZ who presents for for post-hospital follow-up. He has been followed for a history of chest pain with prior abnormal cardiac CT prompting cath in 02/2015 showing 75% Prox-mid Cx, otherwise moderate nonobstructive disease, normal LVEF - recent nuclear stress test had shown no ischemia. Medical therapy was recommended, with consideration of PCI for recurrent symptoms to Cx. Plavix was added. 2D Echo 01/2015 showed EF 65-70%, grade 2 DD, mild LAE. ETT 04/2017 was normal. He recently returned to clinic with chest pain. He had recently found a coworker fatality on the job which had caused a lot of anxiety. Imdur was added and cath was arranged. This was done 03/07/18 showing 30% ostial LM, 40% mid-distal LAD, 40% ostial-mid LAD, 75% prox-mid Cx, 40% mid-distal RCA, 65% distal RCA without change from 2016 study, normal LVEDP. Continued medical therapy was recommended. Most recent labs 02/2018 showed LDL 22, TSH, Mg 1.7, CBC wnl, glucose 133, Cr 0.74, LFTs ok. MagOx was offered for occasional leg cramps at end of long workday, but patient declined.  He returns for follow-up today feeling overall better in terms of recent chest pain. He still has bouts of anxiety and is using medication periodically for this. He has to walk by the scene every  day. His edema appears well controlled (not present on exam today). No SOB, orthopnea, palpitations, syncope reported. He picked up some OTC magnesium at [facility] and feels as though it is helping his leg cramps.  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 CAD (coronary artery disease)    a.  cath in 02/2015 showing 75% Prox-mid Cx, otherwise nonobstructive disease. b. Cath 02/2018 stable from prior -> med rx.  Marland Kitchen GERD (gastroesophageal reflux disease)   . Gout   . Heart murmur   . Hypercholesteremia   . Hypertension   . Taste absent 01/24/12   "had wisdom tooth pulled; couple days later 2 crowns put in; can't taste since"  . Type II diabetes mellitus ([place])     Past Surgical History:  Procedure Laterality Date  . CARDIAC CATHETERIZATION N/A 02/25/2015   Procedure: Left Heart Cath and Coronary Angiography;  Surgeon: Belva Crome, MD;  Location: [place] [facility];  Service: Cardiovascular;  Laterality: N/A;  . LEFT HEART CATH AND CORONARY ANGIOGRAPHY N/A 03/07/2018   Procedure: LEFT HEART CATH AND CORONARY ANGIOGRAPHY;  Surgeon: Sherren Mocha, MD;  Location: [place] [facility];  Service: Cardiovascular;  Laterality: N/A;  . NO PAST SURGERIES      Current Medications: Current Meds  Medication Sig  . ALPRAZolam (XANAX) 0.25 MG tablet Take 0.125-0.25 mg by mouth  2 (two) times daily as needed for anxiety.   Marland Kitchen aspirin EC 81 MG tablet Take 81 mg by mouth daily.  . carvedilol (COREG) 25 MG tablet TAKE 1 TABLET BY MOUTH TWICE A DAY  . clopidogrel (PLAVIX) 75 MG tablet TAKE 1 TABLET (75 MG TOTAL) BY MOUTH DAILY.  . hydrochlorothiazide (HYDRODIURIL) 12.5 MG tablet Take 12.5 mg by mouth as needed (swelling).   Marland Kitchen KRILL OIL PO Take 1 tablet by mouth daily.  Marland Kitchen losartan-hydrochlorothiazide (HYZAAR) 50-12.5 MG tablet Take 1 tablet by mouth daily.  . metFORMIN (GLUCOPHAGE-XR) 500 MG 24 hr  tablet Take 1,500 mg by mouth every evening.   . naproxen sodium (ALEVE) 220 MG tablet Take 220 mg by mouth daily as needed (pain).  . nitroGLYCERIN (NITROSTAT) 0.4 MG SL tablet PLACE 1 TABLET UNDER TONGUE EVERY 5 MINUTES AS NEEDED F0R CHEST PAIN (3 DOSES MAX)  . pantoprazole (PROTONIX) 40 MG tablet Take 1 tablet (40 mg total) by mouth daily.  . rosuvastatin (CRESTOR) 40 MG tablet TAKE 1 TABLET (40 MG TOTAL) BY MOUTH DAILY AT 6 PM.  . TRESIBA FLEXTOUCH 200 UNIT/ML SOPN Inject 40-60 mg as directed at bedtime.   . TRULICITY 1.5 IE/3.3IR SOPN Inject 1.5 mg as directed every Sunday.       Allergies:   Sulfa antibiotics   Social History   Socioeconomic History  . Marital status: Married    Spouse name: Not on file  . Number of children: Not on file  . Years of education: Not on file  . Highest education level: Not on file  Occupational History  . Not on file  Social Needs  . Financial resource strain: Not on file  . Food insecurity:    Worry: Not on file    Inability: Not on file  . Transportation needs:    Medical: Not on file    Non-medical: Not on file  Tobacco Use  . Smoking status: Former Smoker    Packs/day: 0.25  . Smokeless tobacco: Current User  Substance and Sexual Activity  . Alcohol use: Yes    Alcohol/week: 7.0 standard drinks    Types: 7 Cans of beer per week  . Drug use: No  . Sexual activity: Not on file  Lifestyle  . Physical activity:    Days per week: Not on file    Minutes per session: Not on file  . Stress: Not on file  Relationships  . Social connections:    Talks on phone: Not on file    Gets together: Not on file    Attends religious service: Not on file    Active member of club or organization: Not on file    Attends meetings of clubs or organizations: Not on file    Relationship status: Not on file  Other Topics Concern  . Not on file  Social History Narrative  . Not on file     Family History:  The patient's family history includes CAD  in his father.  ROS:   Please see the history of present illness. No SI/HI demonstrated. All other systems are reviewed and otherwise negative.    PHYSICAL EXAM:   VS:  BP 130/82   Pulse 76   Ht 5\' 11"  (1.803 m)   Wt 221 lb 12.8 oz (100.6 kg)   BMI 30.93 kg/m   BMI: Body mass index is 30.93 kg/m. Affect appropriate Healthy:  appears stated age 21: normal Neck supple with no adenopathy JVP normal no bruits no  thyromegaly Lungs clear with no wheezing and good diaphragmatic motion Heart:  S1/S2 no murmur, no rub, gallop or click PMI normal Abdomen: benighn, BS positve, no tenderness, no AAA no bruit.  No HSM or HJR Distal pulses intact with no bruits No edema Neuro non-focal Skin warm and dry No muscular weakness   Wt Readings from Last 3 Encounters:  07/28/18 221 lb 12.8 oz (100.6 kg)  03/25/18 219 lb 12.8 oz (99.7 kg)  03/07/18 213 lb (96.6 kg)      Studies/Labs Reviewed:   EKG:  EKG was not ordered today  Recent Labs: 03/05/2018: ALT 42; BUN 12; Creatinine, Ser 0.74; Hemoglobin 15.7; Magnesium 1.7; Platelets 191; Potassium 4.0; Sodium 137; TSH 1.480   Lipid Panel    Component Value Date/Time   CHOL 69 (L) 03/05/2018 0948   TRIG 91 03/05/2018 0948   HDL 29 (L) 03/05/2018 0948   CHOLHDL 2.4 03/05/2018 0948   CHOLHDL 3 04/15/2015 0752   VLDL 17.0 04/15/2015 0752   LDLCALC 22 03/05/2018 0948    Additional studies/ records that were reviewed today include: Summarized above.   ASSESSMENT & PLAN:   1. CAD - stable as above. Continue current regimen. Stable moderate CAD in RCA/Circumflex cath 03/07/18  No imdur due to headaches  2. Myalgias - improved with Magnesium continue  3. HTN - Well controlled.  Continue current medications and low sodium Dash type diet.   4. Hyperlipidemia - continue present regimen. If leg cramping does not continue to improve, consideration could be given to decreasing his statin to see if that helps.  Disposition: F/u with me  in a year   Medication Adjustments/Labs and Tests Ordered: Current medicines are reviewed at length with the patient today.  Concerns regarding medicines are outlined above. Medication changes, Labs and Tests ordered today are summarized above and listed in the Patient Instructions accessible in Encounters.   Signed, Jenkins Rouge, MD  07/28/2018 8:11 AM    [facility] [place], [place], [place]  [address] Phone: [phone]; Fax: [phone]

## 2018-07-28 ENCOUNTER — Ambulatory Visit: Payer: BC Managed Care – PPO | Admitting: Cardiovascular Disease

## 2018-07-28 ENCOUNTER — Encounter: Payer: Self-pay | Admitting: Cardiovascular Disease

## 2018-07-28 VITALS — BP 130/82 | HR 76 | Ht 71.0 in | Wt 221.8 lb

## 2018-07-28 DIAGNOSIS — I10 Essential (primary) hypertension: Secondary | ICD-10-CM

## 2018-07-28 DIAGNOSIS — I2510 Atherosclerotic heart disease of native coronary artery without angina pectoris: Secondary | ICD-10-CM

## 2018-07-28 DIAGNOSIS — E785 Hyperlipidemia, unspecified: Secondary | ICD-10-CM

## 2018-07-28 NOTE — Patient Instructions (Addendum)

## 2018-12-30 ENCOUNTER — Telehealth: Payer: Self-pay | Admitting: Cardiovascular Disease

## 2018-12-30 MED ORDER — NITROGLYCERIN 0.4 MG SL SUBL: SUBLINGUAL_TABLET | SUBLINGUAL | 3 refills | Status: DC

## 2018-12-30 NOTE — Telephone Encounter (Signed)
Called patient with Dr. Kyla Balzarine recommendations. Patient stated he could use some new nitroglycerin, sent in order for Nitro. Patient stated he does not feel like his chest pain is that bad, so he will call our office to have a lexiscan scheduled if his symptoms get worse. Patient stated he would like the note, sent note through [place]. Patient will call his PCP about other medications for anxiety.

## 2018-12-30 NOTE — Telephone Encounter (Signed)
Would try to get on anxiolytic that he can tolerate He has been intolerant to imdur in passed Continue his other heart meds I don't know what to tell him about work I can write a note indicating only 8 hours/day due to heart condition If he has SSCP that requires nitro should have lexiscan myovue ordered

## 2018-12-30 NOTE — Telephone Encounter (Signed)
Called patient back about his message. Patient stated he is working 12 to 15 hours a day, 5 days a week. Patient states he cannot keep up at this pace. Patient stated he is also having chest tightness often, but no pain just feeling uncomfortable. Patient also complained about elevated BP and being SOB at times, but he stated I do not feel like I am having a heart attack. Patient just does not know what he should do, if he should just retire. 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 Patient stated Dr. Johnsie Cancel told him he should only be working 8 hours a day. Patient wanted to know what Dr. Johnsie Cancel thinks and get his advisement. Patient stated he has talked with his PCP and received medication for anxiety, but he does not like to take it due to side effects of tiredness. Will forward to Dr. Johnsie Cancel.

## 2018-12-30 NOTE — Telephone Encounter (Signed)
Patient would has some concerns about some things that is going on about work and life, lots of stress that he would like to get some advice from Dr. Johnsie Cancel.

## 2019-03-07 ENCOUNTER — Other Ambulatory Visit: Payer: Self-pay | Admitting: Cardiovascular Disease

## 2019-06-03 ENCOUNTER — Other Ambulatory Visit: Payer: Self-pay | Admitting: Physician Assistant

## 2019-06-28 ENCOUNTER — Other Ambulatory Visit: Payer: Self-pay | Admitting: Physician Assistant

## 2019-08-31 NOTE — Progress Notes (Signed)
Cardiology Office Note    Date:  09/04/2019  ID:  Gregory Fox, Gregory Fox 25-Mar-1956, MRN [id number] PCP:  Philmore Pali, NP  Cardiologist:  Jenkins Rouge, MD   Chief Complaint: f/u cath  History of Present Illness:   64 y.o. with CAD, HTN, HLD, DM September hospitalized for chest pain. 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 Imdur added. But stopped due to side effects / headaches  Significant anxiety Offered Lexiscan myovue to risk stratify but he declined at that time   He picked up some OTC magnesium at [facility] and feels as though it is helping his leg cramps.  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 CAD (coronary artery disease)    a.  cath in 02/2015 showing 75% Prox-mid Cx, otherwise nonobstructive disease. b. Cath 02/2018 stable from prior -> med rx.  Marland Kitchen GERD (gastroesophageal reflux disease)   . Gout   . Heart murmur   . Hypercholesteremia   . Hypertension   . Taste absent 01/24/12   "had wisdom tooth pulled; couple days later 2 crowns put in; can't taste since"  . Type II diabetes mellitus ([place])     Past Surgical History:  Procedure Laterality Date  . CARDIAC CATHETERIZATION N/A 02/25/2015   Procedure: Left Heart Cath and Coronary Angiography;  Surgeon: Belva Crome, MD;  Location: [place] [facility];  Service: Cardiovascular;  Laterality: N/A;  . LEFT HEART CATH AND CORONARY ANGIOGRAPHY N/A 03/07/2018   Procedure: LEFT HEART CATH AND CORONARY ANGIOGRAPHY;  Surgeon: Sherren Mocha, MD;  Location: [facility] [facility];  Service: Cardiovascular;  Laterality: N/A;  . NO PAST SURGERIES      Current Medications: Current Meds  Medication Sig  . ALPRAZolam  (XANAX) 0.25 MG tablet Take 0.125-0.25 mg by mouth 2 (two) times daily as needed for anxiety.   Marland Kitchen aspirin EC 81 MG tablet Take 81 mg by mouth daily.  . carvedilol (COREG) 25 MG tablet TAKE 1 TABLET BY MOUTH TWICE A DAY  . clopidogrel (PLAVIX) 75 MG tablet TAKE 1 TABLET (75 MG TOTAL) BY MOUTH DAILY.  . hydrochlorothiazide (HYDRODIURIL) 12.5 MG tablet Take 12.5 mg by mouth as needed (swelling).   Marland Kitchen KRILL OIL PO Take 1 tablet by mouth daily.  Marland Kitchen losartan-hydrochlorothiazide (HYZAAR) 50-12.5 MG tablet Take 1 tablet by mouth daily. Please keep upcoming appt in February with Dr. Johnsie Cancel before anymore refills. Thank you  . metFORMIN (GLUCOPHAGE-XR) 500 MG 24 hr tablet Take 1,500 mg by mouth every evening.   . naproxen sodium (ALEVE) 220 MG tablet Take 220 mg by mouth daily as needed (pain).  . nitroGLYCERIN (NITROSTAT) 0.4 MG SL tablet Take 1 tablet under your tongue, while sitting for chest pain. If no relief of pain may take one tab every 5 minutes up to 3 tabs total over 15 min. If no relief CALL 911.  . pantoprazole (PROTONIX) 40 MG tablet Take 1 tablet (40 mg total) by mouth daily.  . rosuvastatin (CRESTOR) 40 MG tablet TAKE 1 TABLET (40 MG TOTAL) BY MOUTH DAILY AT 6 PM.  . TRESIBA FLEXTOUCH 200 UNIT/ML  SOPN Inject 40-60 mg as directed at bedtime.   . TRULICITY 1.5 0000000 SOPN Inject 1.5 mg as directed every Sunday.       Allergies:   Sulfa antibiotics   Social History   Socioeconomic History  . Marital status: Married    Spouse name: Not on file  . Number of children: Not on file  . Years of education: Not on file  . Highest education level: Not on file  Occupational History  . Not on file  Tobacco Use  . Smoking status: Former Smoker    Packs/day: 0.25  . Smokeless tobacco: Current User  Substance and Sexual Activity  . Alcohol use: Yes    Alcohol/week: 7.0 standard drinks    Types: 7 Cans of beer per week  . Drug use: No  . Sexual activity: Not on file  Other Topics Concern    . Not on file  Social History Narrative  . Not on file   Social Determinants of Health   Financial Resource Strain:   . Difficulty of Paying Living Expenses: Not on file  Food Insecurity:   . Worried About Charity fundraiser in the Last Year: Not on file  . Ran Out of Food in the Last Year: Not on file  Transportation Needs:   . Lack of Transportation (Medical): Not on file  . Lack of Transportation (Non-Medical): Not on file  Physical Activity:   . Days of Exercise per Week: Not on file  . Minutes of Exercise per Session: Not on file  Stress:   . Feeling of Stress : Not on file  Social Connections:   . Frequency of Communication with Friends and Family: Not on file  . Frequency of Social Gatherings with Friends and Family: Not on file  . Attends Religious Services: Not on file  . Active Member of Clubs or Organizations: Not on file  . Attends Archivist Meetings: Not on file  . Marital Status: Not on file     Family History:  The patient's family history includes CAD in his father.  ROS:   Please see the history of present illness. No SI/HI demonstrated. All other systems are reviewed and otherwise negative.    PHYSICAL EXAM:   VS:  BP (!) 134/94   Pulse 70   Ht 5\' 11"  (1.803 m)   Wt 222 lb 1.9 oz (100.8 kg)   SpO2 99%   BMI 30.98 kg/m   BMI: Body mass index is 30.98 kg/m. Affect appropriate Healthy:  appears stated age 64: normal Neck supple with no adenopathy JVP normal no bruits no thyromegaly Lungs clear with no wheezing and good diaphragmatic motion Heart:  S1/S2 no murmur, no rub, gallop or click PMI normal Abdomen: benighn, BS positve, no tenderness, no AAA no bruit.  No HSM or HJR Distal pulses intact with no bruits No edema Neuro non-focal Skin warm and dry No muscular weakness   Wt Readings from Last 3 Encounters:  09/04/19 222 lb 1.9 oz (100.8 kg)  07/28/18 221 lb 12.8 oz (100.6 kg)  03/25/18 219 lb 12.8 oz (99.7 kg)       Studies/Labs Reviewed:   EKG:  NSR rate 70 normal ECG 09/04/19  Recent Labs: No results found for requested labs within last 8760 hours.   Lipid Panel    Component Value Date/Time   CHOL 69 (L) 03/05/2018 0948   TRIG 91 03/05/2018 0948   HDL 29 (L) 03/05/2018 0948   CHOLHDL 2.4  03/05/2018 0948   CHOLHDL 3 04/15/2015 0752   VLDL 17.0 04/15/2015 0752   LDLCALC 22 03/05/2018 0948    Additional studies/ records that were reviewed today include: Summarized above.   ASSESSMENT & PLAN:   1. CAD - stable as above. Continue current regimen. Stable moderate CAD in RCA/Circumflex cath 03/07/18  No imdur due to headaches  2. Myalgias - improved with Magnesium continue  3. HTN - Well controlled.  Continue current medications and low sodium Dash type diet.   4. Hyperlipidemia - continue present regimen. If leg cramping does not continue to improve, consideration could be given to decreasing his statin to see if that helps.  Disposition: F/u with me in  6 months   Medication Adjustments/Labs and Tests Ordered: Current medicines are reviewed at length with the patient today.  Concerns regarding medicines are outlined above. Medication changes, Labs and Tests ordered today are summarized above and listed in the Patient Instructions accessible in Encounters.   Signed, Jenkins Rouge, MD  09/04/2019 8:41 AM    [facility] [address], [place], [place]  [address] Phone: [phone]; Fax: [phone]

## 2019-09-04 ENCOUNTER — Ambulatory Visit: Payer: BC Managed Care – PPO | Admitting: Cardiovascular Disease

## 2019-09-04 ENCOUNTER — Other Ambulatory Visit: Payer: Self-pay

## 2019-09-04 ENCOUNTER — Encounter: Payer: Self-pay | Admitting: Cardiovascular Disease

## 2019-09-04 VITALS — BP 134/94 | HR 70 | Ht 71.0 in | Wt 222.1 lb

## 2019-09-04 DIAGNOSIS — I10 Essential (primary) hypertension: Secondary | ICD-10-CM | POA: Diagnosis not present

## 2019-09-04 DIAGNOSIS — I2510 Atherosclerotic heart disease of native coronary artery without angina pectoris: Secondary | ICD-10-CM

## 2019-09-04 DIAGNOSIS — E785 Hyperlipidemia, unspecified: Secondary | ICD-10-CM | POA: Diagnosis not present

## 2019-09-04 MED ORDER — NITROGLYCERIN 0.4 MG SL SUBL: SUBLINGUAL_TABLET | SUBLINGUAL | 3 refills | Status: DC

## 2019-09-04 NOTE — Patient Instructions (Signed)
Medication Instructions:   *If you need a refill on your cardiac medications before your next appointment, please call your pharmacy*  Lab Work:  If you have labs (blood work) drawn today and your tests are completely normal, you will receive your results only by: . MyChart Message (if you have MyChart) OR . A paper copy in the mail If you have any lab test that is abnormal or we need to change your treatment, we will call you to review the results.  Follow-Up: At [facility], you and your health needs are our priority.  As part of our continuing mission to provide you with exceptional heart care, we have created designated Provider Care Teams.  These Care Teams include your primary Cardiologist (physician) and Advanced Practice Providers (APPs -  Physician Assistants and Nurse Practitioners) who all work together to provide you with the care you need, when you need it.  Your next appointment:   6 months  The format for your next appointment:   In Person  Provider:   You may see Peter Nishan, MD or one of the following Advanced Practice Providers on your designated Care Team:    Lori Gerhardt, NP  Laura Ingold, NP  Jill McDaniel, NP    

## 2019-09-09 ENCOUNTER — Other Ambulatory Visit: Payer: Self-pay | Admitting: Cardiovascular Disease

## 2019-09-13 ENCOUNTER — Ambulatory Visit: Payer: BC Managed Care – PPO | Attending: Internal Medicine

## 2019-09-13 DIAGNOSIS — Z23 Encounter for immunization: Secondary | ICD-10-CM | POA: Insufficient documentation

## 2019-09-13 NOTE — Progress Notes (Signed)
   Covid-19 Vaccination Clinic  Name:  Gregory Fox    MRN: [id number] DOB: 04-Dec-1955  09/13/2019  [name] was observed post Covid-19 immunization for 15 minutes without incidence. He was provided with Vaccine Information Sheet and instruction to access the V-Safe system.   [name] was instructed to call 911 with any severe reactions post vaccine: Marland Kitchen Difficulty breathing  . Swelling of your face and throat  . A fast heartbeat  . A bad rash all over your body  . Dizziness and weakness    Immunizations Administered    Name Date Dose VIS Date Route   Pfizer COVID-19 Vaccine 09/13/2019  8:24 AM 0.3 mL 07/03/2019 Intramuscular   Manufacturer: [place]   Lot: [id number]   [place]: [id number]

## 2019-09-24 ENCOUNTER — Other Ambulatory Visit: Payer: Self-pay | Admitting: Physician Assistant

## 2019-10-07 ENCOUNTER — Ambulatory Visit: Payer: BC Managed Care – PPO | Attending: Internal Medicine

## 2019-10-07 DIAGNOSIS — Z23 Encounter for immunization: Secondary | ICD-10-CM

## 2019-10-07 NOTE — Progress Notes (Signed)
   Covid-19 Vaccination Clinic  Name:  Gregory Fox    MRN: [id number] DOB: 12/20/1955  10/07/2019  Gregory Fox was observed post Covid-19 immunization for 15 minutes without incident. He was provided with Vaccine Information Sheet and instruction to access the V-Safe system.   Gregory Fox was instructed to call 911 with any severe reactions post vaccine: Marland Kitchen Difficulty breathing  . Swelling of face and throat  . A fast heartbeat  . A bad rash all over body  . Dizziness and weakness   Immunizations Administered    Name Date Dose VIS Date Route   Pfizer COVID-19 Vaccine 10/07/2019  8:13 AM 0.3 mL 07/03/2019 Intramuscular   Manufacturer: [place]   Lot: [id number]   [place]: [id number]

## 2019-10-09 ENCOUNTER — Other Ambulatory Visit: Payer: Self-pay

## 2019-10-12 ENCOUNTER — Other Ambulatory Visit: Payer: Self-pay

## 2019-10-12 ENCOUNTER — Ambulatory Visit (INDEPENDENT_AMBULATORY_CARE_PROVIDER_SITE_OTHER): Payer: BC Managed Care – PPO

## 2019-10-12 ENCOUNTER — Other Ambulatory Visit: Payer: Self-pay | Admitting: Podiatry

## 2019-10-12 ENCOUNTER — Ambulatory Visit: Payer: BC Managed Care – PPO | Admitting: Podiatry

## 2019-10-12 DIAGNOSIS — M79671 Pain in right foot: Secondary | ICD-10-CM

## 2019-10-12 DIAGNOSIS — D219 Benign neoplasm of connective and other soft tissue, unspecified: Secondary | ICD-10-CM

## 2019-10-12 DIAGNOSIS — M722 Plantar fascial fibromatosis: Secondary | ICD-10-CM | POA: Diagnosis not present

## 2019-10-12 NOTE — Progress Notes (Signed)
  Subjective:  Patient ID: Gregory Fox, male    DOB: 1956-01-30,  MRN: [id number]  Chief Complaint  Patient presents with  . Foot Pain    pt c/o pain and knot below of Rt 1st sub met x couple years, has gotten a lot bigger past 6-8 mo; 3/10 burning -gets red and swollen at times -pt denies injury -worrse with being on feet for long period of time Tx: none   . Diabetes    FBS; 145 A1C; 7.3 PCP: Lamb x years    64 y.o. male presents with the above complaint. History confirmed with patient.   Objective:  Physical Exam: warm, good capillary refill, no trophic changes or ulcerative lesions, normal DP and PT pulses and normal sensory exam. Left Foot: small palpable nodule medial arch but without pain Right Foot: large nodule medial plantar arch with POP   No images are attached to the encounter.  Radiographs: X-ray of the right foot: soft tissue swelling, degenerative changes of the 1st MPJ joint, subchondral sclerosis of the ** joint, and joint space narrowing, dorsal spur 1st met Assessment:   1. Plantar fasciitis   2. Fibroma      Plan:  Patient was evaluated and treated and all questions answered.  Plantar Fibroma R -Educated on etiology -Injection as below  Procedure: Fibroma injection Location: right medial plantar fascia  Skin Prep: Alcohol. Injectate: 0.5 cc 0.5 cc marcaine plain, 0.5 cc dexamethasone phosphate. Disposition: Patient tolerated procedure well. Injection site dressed with a band-aid.   Return in about 3 weeks (around 11/02/2019).

## 2019-10-16 ENCOUNTER — Other Ambulatory Visit: Payer: Self-pay | Admitting: Podiatry

## 2019-10-16 DIAGNOSIS — M722 Plantar fascial fibromatosis: Secondary | ICD-10-CM

## 2019-11-02 ENCOUNTER — Ambulatory Visit: Payer: BC Managed Care – PPO | Admitting: Podiatry

## 2019-11-16 ENCOUNTER — Other Ambulatory Visit: Payer: Self-pay

## 2019-11-16 ENCOUNTER — Ambulatory Visit: Payer: BC Managed Care – PPO | Admitting: Podiatry

## 2019-11-16 DIAGNOSIS — M722 Plantar fascial fibromatosis: Secondary | ICD-10-CM

## 2019-11-16 DIAGNOSIS — D219 Benign neoplasm of connective and other soft tissue, unspecified: Secondary | ICD-10-CM | POA: Diagnosis not present

## 2019-11-16 NOTE — Progress Notes (Signed)
  Subjective:  Patient ID: Gregory Fox, male    DOB: 11/13/1955,  MRN: [id number]  Chief Complaint  Patient presents with  . fibroma    F?U Rt fibroma Pt states," I thnk it's the same, the nkow is still there and tender when I walk on it a lot." tx: none    63 y.o. male presents with the above complaint. History confirmed with patient.   Objective:  Physical Exam: warm, good capillary refill, no trophic changes or ulcerative lesions, normal DP and PT pulses and normal sensory exam. Left Foot: small palpable nodule medial arch but without pain Right Foot: large nodule medial plantar arch with POP   Assessment:   1. Plantar fasciitis   2. Fibroma      Plan:  Patient was evaluated and treated and all questions answered.  Plantar Fibroma R -Discussed removal given lack of improvement with injection therapy -Consent reviewed and signed. Patient would like to think over the surgery. Will be [place] and unable to drive will need his wife to be able to drive him per pt. -Patient has failed all conservative therapy and wishes to proceed with surgical intervention. All risks, benefits, and alternatives discussed with patient. No guarantees given. Consent reviewed and signed by patient. -Planned procedures: right foot excision of plantar fibroma   No follow-ups on file.

## 2019-11-16 NOTE — Patient Instructions (Signed)
Pre-Operative Instructions  Congratulations, you have decided to take an important step towards improving your quality of life.  You can be assured that the doctors and staff at [facility] will be with you every step of the way.  Here are some important things you should know:  1. Plan to be at the surgery center/hospital at least 1 (one) hour prior to your scheduled time, unless otherwise directed by the surgical center/hospital staff.  You must have a responsible adult accompany you, remain during the surgery and drive you home.  Make sure you have directions to the surgical center/hospital to ensure you arrive on time. 2. If you are having surgery at [facility] or [facility] hospitals, you will need a copy of your medical history and physical form from your family physician within one month prior to the date of surgery. We will give you a form for your primary physician to complete.  3. We make every effort to accommodate the date you request for surgery.  However, there are times where surgery dates or times have to be moved.  We will contact you as soon as possible if a change in schedule is required.   4. No aspirin/ibuprofen for one week before surgery.  If you are on aspirin, any non-steroidal anti-inflammatory medications (Mobic, Aleve, Ibuprofen) should not be taken seven (7) days prior to your surgery.  You make take Tylenol for pain prior to surgery.  5. Medications - If you are taking daily heart and blood pressure medications, seizure, reflux, allergy, asthma, anxiety, pain or diabetes medications, make sure you notify the surgery center/hospital before the day of surgery so they can tell you which medications you should take or avoid the day of surgery. 6. No food or drink after midnight the night before surgery unless directed otherwise by surgical center/hospital staff. 7. No alcoholic beverages 24-hours prior to surgery.  No smoking 24-hours prior or 24-hours after  surgery. 8. Wear loose pants or shorts. They should be loose enough to fit over bandages, boots, and casts. 9. Don't wear slip-on shoes. Sneakers are preferred. 10. Bring your boot with you to the surgery center/hospital.  Also bring crutches or a walker if your physician has prescribed it for you.  If you do not have this equipment, it will be provided for you after surgery. 11. If you have not been contacted by the surgery center/hospital by the day before your surgery, call to confirm the date and time of your surgery. 12. Leave-time from work may vary depending on the type of surgery you have.  Appropriate arrangements should be made prior to surgery with your employer. 13. Prescriptions will be provided immediately following surgery by your doctor.  Fill these as soon as possible after surgery and take the medication as directed. Pain medications will not be refilled on weekends and must be approved by the doctor. 14. Remove nail polish on the operative foot and avoid getting pedicures prior to surgery. 15. Wash the night before surgery.  The night before surgery wash the foot and leg well with water and the antibacterial soap provided. Be sure to pay special attention to beneath the toenails and in between the toes.  Wash for at least three (3) minutes. Rinse thoroughly with water and dry well with a towel.  Perform this wash unless told not to do so by your physician.  Enclosed: 1 Ice pack (please put in freezer the night before surgery)   1 Hibiclens skin cleaner     Pre-op instructions  If you have any questions regarding the instructions, please do not hesitate to call our office.  [place]: [address], [place], [place] [address] -- [phone]  [place]: [address] [place], [place] [address] -- [phone]  [place]: [address], [place], [place] [address] -- [phone]   Website: [email or web address] 

## 2019-12-28 ENCOUNTER — Other Ambulatory Visit: Payer: Self-pay | Admitting: Cardiovascular Disease

## 2019-12-29 NOTE — Telephone Encounter (Signed)
Got refill request for nitro and it was just refilled in 08/2019  To be taken PRN with three remaining refills so called patient to check on him and see if he was having chest pain since it seemed like nitro was being taking so much every month. Patient assured me he is doing fine and not taking nitro right now. No chest pain. He said his Nitro must be on automatic refill and he does not need refill right now and that he will call the pharmacy tomorrow to get it straightened out. He thanked me for checking up on him.

## 2020-04-07 NOTE — Progress Notes (Signed)
Cardiology Office Note    Date:  04/20/2020  ID:  Gregory Fox, Gregory Fox July 14, 1956, MRN [id number] PCP:  Philmore Pali, NP  Cardiologist:  Jenkins Rouge, MD   Chief Complaint: f/u cath  History of Present Illness:   64 y.o. with CAD, HTN, HLD, DM September hospitalized for chest pain. 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 Imdur added. But stopped due to side effects / headaches  Significant anxiety Offered Lexiscan myovue to risk stratify but he declined at that time   He picked up some OTC magnesium at [facility] and feels as though it is helping his leg cramps.  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 CAD (coronary artery disease)    a.  cath in 02/2015 showing 75% Prox-mid Cx, otherwise nonobstructive disease. b. Cath 02/2018 stable from prior -> med rx.  Marland Kitchen GERD (gastroesophageal reflux disease)   . Gout   . Heart murmur   . Hypercholesteremia   . Hypertension   . Taste absent 01/24/12   "had wisdom tooth pulled; couple days later 2 crowns put in; can't taste since"  . Type II diabetes mellitus ([place])     Past Surgical History:  Procedure Laterality Date  . CARDIAC CATHETERIZATION N/A 02/25/2015   Procedure: Left Heart Cath and Coronary Angiography;  Surgeon: Belva Crome, MD;  Location: [facility];  Service: Cardiovascular;  Laterality: N/A;  . LEFT HEART CATH AND CORONARY ANGIOGRAPHY N/A 03/07/2018   Procedure: LEFT HEART CATH AND CORONARY ANGIOGRAPHY;  Surgeon: Sherren Mocha, MD;  Location: [facility];  Service: Cardiovascular;  Laterality: N/A;  . NO PAST SURGERIES       Current Medications: Current Meds  Medication Sig  . aspirin EC 81 MG tablet Take 81 mg by mouth daily.  Marland Kitchen atorvastatin (LIPITOR) 40 MG tablet Take 40 mg by mouth at bedtime.  . BD PEN NEEDLE MICRO U/F 32G X 6 MM MISC   . carvedilol (COREG) 25 MG tablet TAKE 1 TABLET BY MOUTH TWICE A DAY  . clopidogrel (PLAVIX) 75 MG tablet TAKE 1 TABLET (75 MG TOTAL) BY MOUTH DAILY.  Marland Kitchen KRILL OIL PO Take 1 tablet by mouth daily.  Marland Kitchen losartan-hydrochlorothiazide (HYZAAR) 50-12.5 MG tablet Take 1 tablet by mouth daily.  . metFORMIN (GLUCOPHAGE-XR) 500 MG 24 hr tablet Take 1,500 mg by mouth every evening.   . nitroGLYCERIN (NITROSTAT) 0.4 MG SL tablet Take 1 tablet under your tongue, while sitting for chest pain. If no relief of pain may take one tab every 5 minutes up to 3 tabs total over 15 min. If no relief CALL 911.  Marland Kitchen Olopatadine HCl 0.2 % SOLN Apply 1 drop to eye 2 (two) times daily.  Glory Rosebush VERIO test strip USE TO CHECK BLOOD GLUCOSE SUGAR TWICE A DAY BEFORE MEALS 50  . pantoprazole (PROTONIX) 40 MG tablet Take 1 tablet (40 mg total) by mouth daily.  . TRESIBA FLEXTOUCH 200 UNIT/ML SOPN Inject 40-60 mg as directed at bedtime.   Marland Kitchen  TRULICITY 1.5 PX/1.0GY SOPN Inject 1.5 mg as directed every Sunday.       Allergies:   Sulfa antibiotics   Social History   Socioeconomic History  . Marital status: Married    Spouse name: Not on file  . Number of children: Not on file  . Years of education: Not on file  . Highest education level: Not on file  Occupational History  . Not on file  Tobacco Use  . Smoking status: Former Smoker    Packs/day: 0.25  . Smokeless tobacco: Current User  Vaping Use  . Vaping Use: Never used  Substance and Sexual Activity  . Alcohol use: Yes    Alcohol/week: 7.0 standard drinks    Types: 7 Cans of beer per week  . Drug use: No  . Sexual activity: Not on file  Other Topics Concern  . Not on file  Social History Narrative  . Not on file   Social Determinants  of Health   Financial Resource Strain:   . Difficulty of Paying Living Expenses: Not on file  Food Insecurity:   . Worried About Charity fundraiser in the Last Year: Not on file  . Ran Out of Food in the Last Year: Not on file  Transportation Needs:   . Lack of Transportation (Medical): Not on file  . Lack of Transportation (Non-Medical): Not on file  Physical Activity:   . Days of Exercise per Week: Not on file  . Minutes of Exercise per Session: Not on file  Stress:   . Feeling of Stress : Not on file  Social Connections:   . Frequency of Communication with Friends and Family: Not on file  . Frequency of Social Gatherings with Friends and Family: Not on file  . Attends Religious Services: Not on file  . Active Member of Clubs or Organizations: Not on file  . Attends Archivist Meetings: Not on file  . Marital Status: Not on file     Family History:  The patient's family history includes CAD in his father.  ROS:   Please see the history of present illness. No SI/HI demonstrated. All other systems are reviewed and otherwise negative.    PHYSICAL EXAM:   VS:  BP 116/66   Pulse 68   Ht 5\' 11"  (1.803 m)   Wt 220 lb (99.8 kg)   SpO2 96%   BMI 30.68 kg/m   BMI: Body mass index is 30.68 kg/m. Affect appropriate Healthy:  appears stated age 61: normal Neck supple with no adenopathy JVP normal no bruits no thyromegaly Lungs clear with no wheezing and good diaphragmatic motion Heart:  S1/S2 no murmur, no rub, gallop or click PMI normal Abdomen: benighn, BS positve, no tenderness, no AAA no bruit.  No HSM or HJR Distal pulses intact with no bruits No edema Neuro non-focal Skin warm and dry No muscular weakness   Wt Readings from Last 3 Encounters:  04/20/20 220 lb (99.8 kg)  09/04/19 222 lb 1.9 oz (100.8 kg)  07/28/18 221 lb 12.8 oz (100.6 kg)      Studies/Labs Reviewed:   EKG:  NSR rate 70 normal ECG 09/04/19  Recent Labs: No results found for  requested labs within last 8760 hours.   Lipid Panel    Component Value Date/Time   CHOL 69 (L) 03/05/2018 0948   TRIG 91 03/05/2018 0948   HDL 29 (L) 03/05/2018 0948   CHOLHDL 2.4 03/05/2018 0948   CHOLHDL 3 04/15/2015 6948  VLDL 17.0 04/15/2015 0752   LDLCALC 22 03/05/2018 0948    Additional studies/ records that were reviewed today include: Summarized above.   ASSESSMENT & PLAN:   1. CAD - stable as above. Continue current regimen. Stable moderate CAD in RCA/Circumflex cath 03/07/18  No imdur due to headaches  2. Myalgias - improved with Magnesium continue  3. HTN - Well controlled.  Continue current medications and low sodium Dash type diet.   4. Hyperlipidemia - continue present regimen. If leg cramping does not continue to improve, consideration could be given to decreasing his statin to see if that helps.  Disposition: F/u with me in  6 months   Medication Adjustments/Labs and Tests Ordered: Current medicines are reviewed at length with the patient today.  Concerns regarding medicines are outlined above. Medication changes, Labs and Tests ordered today are summarized above and listed in the Patient Instructions accessible in Encounters.   Signed, Jenkins Rouge, MD  04/20/2020 8:27 AM    [facility] [place], [place], [place]  [address] Phone: [phone]; Fax: [phone]

## 2020-04-20 ENCOUNTER — Other Ambulatory Visit: Payer: Self-pay

## 2020-04-20 ENCOUNTER — Ambulatory Visit: Payer: BC Managed Care – PPO | Admitting: Cardiovascular Disease

## 2020-04-20 VITALS — BP 116/66 | HR 68 | Ht 71.0 in | Wt 220.0 lb

## 2020-04-20 DIAGNOSIS — I2510 Atherosclerotic heart disease of native coronary artery without angina pectoris: Secondary | ICD-10-CM

## 2020-04-20 NOTE — Patient Instructions (Addendum)

## 2020-09-05 ENCOUNTER — Other Ambulatory Visit: Payer: Self-pay | Admitting: Cardiovascular Disease

## 2020-09-16 ENCOUNTER — Other Ambulatory Visit: Payer: Self-pay | Admitting: Physician Assistant

## 2020-10-01 ENCOUNTER — Other Ambulatory Visit: Payer: Self-pay | Admitting: Cardiovascular Disease

## 2021-04-16 NOTE — Progress Notes (Addendum)
Cardiology Office Note:    Date:  04/17/2021   ID:  Gregory Fox, DOB 10-Jun-1956, MRN [id number]  PCP:  Philmore Pali, NP   [facility] Providers Cardiologist:  Jenkins Rouge, MD     Referring MD: Philmore Pali, NP   Chief Complaint:  F/u for CAD    Patient Profile:   Gregory Fox is a 65 y.o. male with:  Coronary artery disease  Cath 8/19: mod LCx dz (prox 75), mild non-obstr dz in RCA, LAD >> Med Rx  ETT: 10/18: normal Myoview 8/16: no ischemia, low risk  Hypertension  Hyperlipidemia  Diabetes mellitus  GERD  Prior CV studies: LEFT HEART CATH AND CORONARY ANGIOGRAPHY 03/07/2018 Narrative  Ost LM to LM lesion is 30% stenosed.  Mid LAD to Dist LAD lesion is 40% stenosed.  Ost LAD to Mid LAD lesion is 40% stenosed.  Prox Cx to Mid Cx lesion is 75% stenosed.  Mid RCA to Dist RCA lesion is 40% stenosed.  Dist RCA lesion is 65% stenosed. 1. Moderate stenosis of the LCx, unchanged from the 2016 study 2. Diffuse calcific, nonobstructive CAD in the LAD and RCA 3. Normal LVEDP Recommend: the patient's coronary anatomy is stable from his previous study. In fact the LAD and RCA stenoses is less on the current study. Favor ongoing medical therapy.   EXERCISE TOLERANCE TEST 04/25/2017 Narrative  Blood pressure demonstrated a normal response to exercise.  Pt walked for 7:10 of a Bruce protocol GXT. Peak HR of 137 which is 87% predicted maximal HR .  There were no ST or T wave changes to suggest ischemia .  Negative GXT.   NM Myocar Multi W/Spect W/Wall Motion / EF 03/04/2015 Narrative  There was no ST segment deviation noted during stress.  Defect 1: There is a medium defect of moderate severity present in the basal inferior, basal inferolateral, mid inferior, mid inferolateral and apical lateral location. This is fixed. No ischemia noted.  This is a low risk study.  The left ventricular ejection fraction is mildly decreased (45-54%).  Nuclear stress EF:  50%.   ECHOCARDIOGRAM 02/10/15 EF 65-70, normal wall motion, GRII DD, mild LAE, normal RVSF  History of Present Illness: Gregory Fox was last seen by Dr. Johnsie Cancel in 9/21.  He returns for Cardiology f/u.  He is here alone.  Over the last year, he has had significant issues with heartburn.  He has experienced significant regurgitation at night with throat burning and cough.  He can usually take baking soda to calm it down.  He does have a hiatal hernia.  He has an appointment with surgery soon to determine management.  He may need surgery.  Over the last couple of months, he has noted chest pain.  This is described as an ache.  It is nonexertional.  He is able to exert himself without chest symptoms or shortness of breath.  He can climb a flight of stairs or mow his yard with a push mower without symptoms.  He does get tired.  He has not had syncope, orthopnea, leg edema.        Past Medical History:  Diagnosis Date   CAD (coronary artery disease)    a.  cath in 02/2015 showing 75% Prox-mid Cx, otherwise nonobstructive disease. b. Cath 02/2018 stable from prior -> med rx.   GERD (gastroesophageal reflux disease)    Gout    Heart murmur    Hypercholesteremia    Hypertension    Taste  absent 01/24/12   "had wisdom tooth pulled; couple days later 2 crowns put in; can't taste since"   Type II diabetes mellitus (H[facility])    Current Medications: Current Meds  Medication Sig   aspirin EC 81 MG tablet Take 81 mg by mouth daily.   atorvastatin (LIPITOR) 40 MG tablet Take 40 mg by mouth at bedtime.   BD PEN NEEDLE MICRO U/F 32G X 6 MM MISC    carvedilol (COREG) 25 MG tablet Take 1 tablet (25 mg total) by mouth 2 (two) times daily. Please call to schedule follow up appointment.   clopidogrel (PLAVIX) 75 MG tablet TAKE 1 TABLET (75 MG TOTAL) BY MOUTH DAILY.   KRILL OIL PO Take 1 tablet by mouth daily.   losartan-hydrochlorothiazide (HYZAAR) 50-12.5 MG tablet TAKE 1 TABLET BY MOUTH EVERY DAY   metFORMIN  (GLUCOPHAGE-XR) 500 MG 24 hr tablet Take 1,500 mg by mouth every evening.    nitroGLYCERIN (NITROSTAT) 0.4 MG SL tablet Take 1 tablet under your tongue, while sitting for chest pain. If no relief of pain may take one tab every 5 minutes up to 3 tabs total over 15 min. If no relief CALL 911.   Olopatadine HCl 0.2 % SOLN Apply 1 drop to eye 2 (two) times daily.   ONETOUCH VERIO test strip USE TO CHECK BLOOD GLUCOSE SUGAR TWICE A DAY BEFORE MEALS 50   pantoprazole (PROTONIX) 40 MG tablet Take 1 tablet (40 mg total) by mouth daily.   TRESIBA FLEXTOUCH 200 UNIT/ML SOPN Inject 50 Units as directed at bedtime.   TRULICITY 1.5 DG/6.4QI SOPN Inject 1.5 mg as directed every Sunday.     Allergies:   Sulfa antibiotics   Social History   Tobacco Use   Smoking status: Former    Packs/day: 0.25    Types: Cigarettes   Smokeless tobacco: Current  Vaping Use   Vaping Use: Never used  Substance Use Topics   Alcohol use: Yes    Alcohol/week: 7.0 standard drinks    Types: 7 Cans of beer per week   Drug use: No    Family Hx: The patient's family history includes CAD in his father.  Review of Systems  Cardiovascular:  Negative for claudication.  Gastrointestinal:  Positive for heartburn. Negative for hematochezia and melena.  Genitourinary:  Negative for hematuria.    EKGs/Labs/Other Test Reviewed:    EKG:  EKG is   ordered today.  The ekg ordered today demonstrates NSR, HR 72, Q wave in lead III (similar to prior tracing), right bundle branch block (new), QTC 446  Recent Labs: No results found for requested labs within last 8760 hours.   Recent Lipid Panel Lab Results  Component Value Date/Time   CHOL 69 (L) 03/05/2018 09:48 AM   TRIG 91 03/05/2018 09:48 AM   HDL 29 (L) 03/05/2018 09:48 AM   LDLCALC 22 03/05/2018 09:48 AM   Labs obtained through [facility] - personally reviewed and interpreted: 03/30/2021: Total cholesterol 89, HDL 29, LDL 38, triglycerides 121, A1c 8 09/26/2020: Creatinine  0.7, K+ 3.7, ALT 31  Risk Assessment/Calculations:          Physical Exam:    VS:  BP 120/60   Pulse 70   Ht 5\' 11"  (1.803 m)   Wt 214 lb 12.8 oz (97.4 kg)   SpO2 98%   BMI 29.96 kg/m     Wt Readings from Last 3 Encounters:  04/17/21 214 lb 12.8 oz (97.4 kg)  04/20/20 220 lb (99.8  kg)  09/04/19 222 lb 1.9 oz (100.8 kg)    Constitutional:      Appearance: Healthy appearance. Not in distress.  Neck:     Vascular: JVD normal.  Pulmonary:     Effort: Pulmonary effort is normal.     Breath sounds: No wheezing. No rales.  Cardiovascular:     Normal rate. Regular rhythm. Normal S1. Normal S2.      Murmurs: There is no murmur.  Pulses:    Intact distal pulses.  Edema:    Peripheral edema absent.  Abdominal:     Palpations: Abdomen is soft. There is no hepatomegaly.  Skin:    General: Skin is warm and dry.  Neurological:     General: No focal deficit present.     Mental Status: Alert and oriented to person, place and time.     Cranial Nerves: Cranial nerves are intact.       ASSESSMENT & PLAN:   1. Precordial pain 2. RBBB (right bundle branch block) 3. Coronary artery disease involving native coronary artery of native heart without angina pectoris He has a history of moderate disease in the LCx by cardiac catheterization in 2019 and mild to moderate nonobstructive disease elsewhere.  He has been managed medically.  He has had significant issues with acid reflux and has a hiatal hernia.  He may need surgery soon.  He has had occasional chest pain described as an ache but this is nonexertional.  He has been able to exert himself without chest discomfort or shortness of breath.  Right bundle branch block on his electrocardiogram appears to be new.  It is not clear if his chest symptoms are anginal.  I suspect that they are more likely related to acid reflux.  However, given his risk factors and development of right bundle branch block, I have recommended proceeding with stress  testing and echocardiogram to further evaluate.    -Continue aspirin 81 mg every other day, clopidogrel 75 mg daily, atorvastatin 40 mg daily, carvedilol twice daily  -Echocardiogram  -Exercise Myoview  -Follow-up with Dr. Johnsie Cancel in 2 months  4. Essential hypertension Blood pressure is well controlled.  Continue carvedilol 25 mg twice daily, losartan/HCTZ 50/12.5 mg daily.  5. Hypercholesteremia Lipids are well controlled.  Continue atorvastatin 40 mg daily.     Shared Decision Making/Informed Consent The risks [chest pain, shortness of breath, cardiac arrhythmias, dizziness, blood pressure fluctuations, myocardial infarction, stroke/transient ischemic attack, nausea, vomiting, allergic reaction, radiation exposure, metallic taste sensation and life-threatening complications (estimated to be 1 in 10,000)], benefits (risk stratification, diagnosing coronary artery disease, treatment guidance) and alternatives of a nuclear stress test were discussed in detail with Gregory Fox and he agrees to proceed.   Dispo:  Return in about 2 months (around 06/17/2021) for Routine Follow Up w/ Dr. Johnsie Cancel.   Medication Adjustments/Labs and Tests Ordered: Current medicines are reviewed at length with the patient today.  Concerns regarding medicines are outlined above.  Tests Ordered: Orders Placed This Encounter  Procedures   Cardiac Stress Test: Informed Consent Details: Physician/Practitioner Attestation; Transcribe to consent form and obtain patient signature   MYOCARDIAL PERFUSION IMAGING   EKG 12-Lead   ECHOCARDIOGRAM COMPLETE   Medication Changes: No orders of the defined types were placed in this encounter.  Signed, Richardson Dopp, PA-C  04/17/2021 12:47 PM    [facility] [place], [place], [place]  [address] Phone: [phone]; Fax: [phone]

## 2021-04-17 ENCOUNTER — Ambulatory Visit: Payer: BC Managed Care – PPO | Admitting: Physician Assistant

## 2021-04-17 ENCOUNTER — Encounter: Payer: Self-pay | Admitting: Physician Assistant

## 2021-04-17 ENCOUNTER — Other Ambulatory Visit: Payer: Self-pay

## 2021-04-17 VITALS — BP 120/60 | HR 70 | Ht 71.0 in | Wt 214.8 lb

## 2021-04-17 DIAGNOSIS — R072 Precordial pain: Secondary | ICD-10-CM

## 2021-04-17 DIAGNOSIS — I4510 Unspecified right bundle-branch block: Secondary | ICD-10-CM | POA: Diagnosis not present

## 2021-04-17 DIAGNOSIS — I10 Essential (primary) hypertension: Secondary | ICD-10-CM

## 2021-04-17 DIAGNOSIS — E7800 Pure hypercholesterolemia, unspecified: Secondary | ICD-10-CM

## 2021-04-17 DIAGNOSIS — I2510 Atherosclerotic heart disease of native coronary artery without angina pectoris: Secondary | ICD-10-CM

## 2021-04-17 DIAGNOSIS — R9431 Abnormal electrocardiogram [ECG] [EKG]: Secondary | ICD-10-CM

## 2021-04-17 NOTE — Patient Instructions (Addendum)
Medication Instructions:  Your physician recommends that you continue on your current medications as directed. Please refer to the Current Medication list given to you today.  *If you need a refill on your cardiac medications before your next appointment, please call your pharmacy*   Lab Work: None ordered   If you have labs (blood work) drawn today and your tests are completely normal, you will receive your results only by: [facility] (if you have MyChart) OR A paper copy in the mail If you have any lab test that is abnormal or we need to change your treatment, we will call you to review the results.   Testing/Procedures: Your physician has requested that you have an echocardiogram. Echocardiography is a painless test that uses sound waves to create images of your heart. It provides your doctor with information about the size and shape of your heart and how well your heart's chambers and valves are working. This procedure takes approximately one hour. There are no restrictions for this procedure.  Your physician has requested that you have an exercise stress myoview. For further information please visit [email or web address]. Please follow instruction sheet, as given.   Follow-Up: At [facility], you and your health needs are our priority.  As part of our continuing mission to provide you with exceptional heart care, we have created designated Provider Care Teams.  These Care Teams include your primary Cardiologist (physician) and Advanced Practice Providers (APPs -  Physician Assistants and Nurse Practitioners) who all work together to provide you with the care you need, when you need it.  We recommend signing up for the patient portal called "MyChart".  Sign up information is provided on this After Visit Summary.  MyChart is used to connect with patients for Virtual Visits (Telemedicine).  Patients are able to view lab/test results, encounter notes, upcoming appointments, etc.   Non-urgent messages can be sent to your provider as well.   To learn more about what you can do with MyChart, go to [email or web address].    Your next appointment:   1-2 month(s)  The format for your next appointment:   In Person  Provider:   You may see Jenkins Rouge, MD or one of the following Advanced Practice Providers on your designated Care Team:   Cecilie Kicks, NP   Other Instructions None

## 2021-04-25 ENCOUNTER — Telehealth (HOSPITAL_COMMUNITY): Payer: Self-pay

## 2021-04-25 NOTE — Telephone Encounter (Signed)
Detailed instructions left on the patient's answering machine. Asked to call back with any questions. S.Devery Odwyer EMTP 

## 2021-04-28 ENCOUNTER — Encounter: Payer: Self-pay | Admitting: Gastroenterology

## 2021-04-28 ENCOUNTER — Ambulatory Visit: Payer: BC Managed Care – PPO | Admitting: Gastroenterology

## 2021-04-28 VITALS — BP 114/64 | HR 76 | Ht 70.75 in | Wt 214.1 lb

## 2021-04-28 DIAGNOSIS — Z86010 Personal history of colonic polyps: Secondary | ICD-10-CM

## 2021-04-28 DIAGNOSIS — M6208 Separation of muscle (nontraumatic), other site: Secondary | ICD-10-CM

## 2021-04-28 DIAGNOSIS — R1310 Dysphagia, unspecified: Secondary | ICD-10-CM | POA: Diagnosis not present

## 2021-04-28 DIAGNOSIS — K219 Gastro-esophageal reflux disease without esophagitis: Secondary | ICD-10-CM | POA: Diagnosis not present

## 2021-04-28 DIAGNOSIS — Z7901 Long term (current) use of anticoagulants: Secondary | ICD-10-CM | POA: Diagnosis not present

## 2021-04-28 NOTE — Patient Instructions (Signed)
Use abdominal wall binders that can be purchased at [employer].  Please finish your work up with cardiology and contact out office once you have been cleared by Cardiology. You will then be scheduled for a Upper Endoscopy and Colonoscopy.   You have been scheduled for a Barium Esophogram at [facility] (1st floor of the hospital) on 05/04/21 at 9:00am. Please arrive 15 minutes prior to your appointment for registration. Make certain not to have anything to eat or drink 3 hours prior to your test. If you need to reschedule for any reason, please contact radiology at [phone] to do so. __________________________________________________________________ A barium swallow is an examination that concentrates on views of the esophagus. This tends to be a double contrast exam (barium and two liquids which, when combined, create a gas to distend the wall of the oesophagus) or single contrast (non-ionic iodine based). The study is usually tailored to your symptoms so a good history is essential. Attention is paid during the study to the form, structure and configuration of the esophagus, looking for functional disorders (such as aspiration, dysphagia, achalasia, motility and reflux) EXAMINATION You may be asked to change into a gown, depending on the type of swallow being performed. A radiologist and radiographer will perform the procedure. The radiologist will advise you of the type of contrast selected for your procedure and direct you during the exam. You will be asked to stand, sit or lie in several different positions and to hold a small amount of fluid in your mouth before being asked to swallow while the imaging is performed .In some instances you may be asked to swallow barium coated marshmallows to assess the motility of a solid food bolus. The exam can be recorded as a digital or video fluoroscopy procedure. POST PROCEDURE It will take 1-2 days for the barium to pass through your  system. To facilitate this, it is important, unless otherwise directed, to increase your fluids for the next 24-48hrs and to resume your normal diet.  This test typically takes about 30 minutes to perform. __________________________________________________________________________________

## 2021-04-28 NOTE — Progress Notes (Signed)
Gregory Fox    [id number]    [birth year]  Primary Care Physician:Lam, Rudi Rummage, NP  Referring Physician: Philmore Pali, NP [place],  [place] [address]   Chief complaint:  GERD, dysphagia  HPI: 65 year old very pleasant gentleman here for new patient visit with complaints of dysphagia He has been having difficulty swallowing solid food, he has dry mouth and he feels anytime he tries to swallow food gets hung up, he has to take sips of water to wash it down.  He does not have any difficulty with liquids and only occasionally with pills. He has intermittent regurgitation, heartburn and cough.  His last EGD was done in [place] by Dr. Marya Landry in 2015, was unremarkable per patient Last colonoscopy in 2017 by Dr. Melina Copa in [place], had polyps removed.  He was recommended to do follow-up exam in 5 years.  Denies any changes in bowel habits, melena or rectal bleeding. He has diastasis of abdominal recti, ventral hernia.  Other significant medical history includes hypertension, hyperlipidemia, coronary artery disease, on aspirin and Plavix. He is currently undergoing work-up for chest pain, has echocardiogram and stress test scheduled for next week   Outpatient Encounter Medications as of 04/28/2021  Medication Sig   aspirin EC 81 MG tablet Take 81 mg by mouth daily.   atorvastatin (LIPITOR) 40 MG tablet Take 40 mg by mouth at bedtime.   BD PEN NEEDLE MICRO U/F 32G X 6 MM MISC    carvedilol (COREG) 25 MG tablet Take 1 tablet (25 mg total) by mouth 2 (two) times daily. Please call to schedule follow up appointment.   clopidogrel (PLAVIX) 75 MG tablet TAKE 1 TABLET (75 MG TOTAL) BY MOUTH DAILY.   KRILL OIL PO Take 1 tablet by mouth daily.   losartan-hydrochlorothiazide (HYZAAR) 50-12.5 MG tablet TAKE 1 TABLET BY MOUTH EVERY DAY   metFORMIN (GLUCOPHAGE-XR) 500 MG 24 hr tablet Take 1,500 mg by mouth every evening.    Olopatadine HCl 0.2 % SOLN Apply 1 drop to eye 2  (two) times daily.   ONETOUCH VERIO test strip USE TO CHECK BLOOD GLUCOSE SUGAR TWICE A DAY BEFORE MEALS 50   pantoprazole (PROTONIX) 40 MG tablet Take 1 tablet (40 mg total) by mouth daily.   TRESIBA FLEXTOUCH 200 UNIT/ML SOPN Inject 50 Units as directed at bedtime.   TRULICITY 1.5 CH/8.8FO SOPN Inject 1.5 mg as directed every Sunday.    nitroGLYCERIN (NITROSTAT) 0.4 MG SL tablet Take 1 tablet under your tongue, while sitting for chest pain. If no relief of pain may take one tab every 5 minutes up to 3 tabs total over 15 min. If no relief CALL 911. (Patient not taking: Reported on 04/28/2021)   No facility-administered encounter medications on file as of 04/28/2021.    Allergies as of 04/28/2021 - Review Complete 04/28/2021  Allergen Reaction Noted   Sulfa antibiotics Rash 01/24/2012    Past Medical History:  Diagnosis Date   CAD (coronary artery disease)    a.  cath in 02/2015 showing 75% Prox-mid Cx, otherwise nonobstructive disease. b. Cath 02/2018 stable from prior -> med rx.   GERD (gastroesophageal reflux disease)    Gout    Heart murmur    Hypercholesteremia    Hypertension    Taste absent 01/24/12   "had wisdom tooth pulled; couple days later 2 crowns put in; can't taste since"   Type II diabetes mellitus ([place])  Past Surgical History:  Procedure Laterality Date   CARDIAC CATHETERIZATION N/A 02/25/2015   Procedure: Left Heart Cath and Coronary Angiography;  Surgeon: Belva Crome, MD;  Location: [facility];  Service: Cardiovascular;  Laterality: N/A;   LEFT HEART CATH AND CORONARY ANGIOGRAPHY N/A 03/07/2018   Procedure: LEFT HEART CATH AND CORONARY ANGIOGRAPHY;  Surgeon: Sherren Mocha, MD;  Location: [facility];  Service: Cardiovascular;  Laterality: N/A;   NO PAST SURGERIES      Family History  Problem Relation Age of Onset   Breast cancer Mother    Ovarian cancer Mother    Cervical cancer Mother    CAD Father    Colon cancer Maternal Grandfather      Social History   Socioeconomic History   Marital status: Married    Spouse name: Not on file   Number of children: Not on file   Years of education: Not on file   Highest education level: Not on file  Occupational History   Not on file  Tobacco Use   Smoking status: Former    Packs/day: 0.25    Types: Cigarettes   Smokeless tobacco: Current  Vaping Use   Vaping Use: Never used  Substance and Sexual Activity   Alcohol use: Yes    Alcohol/week: 7.0 standard drinks    Types: 7 Cans of beer per week   Drug use: No   Sexual activity: Not on file  Other Topics Concern   Not on file  Social History Narrative   Not on file   Social Determinants of Health   Financial Resource Strain: Not on file  Food Insecurity: Not on file  Transportation Needs: Not on file  Physical Activity: Not on file  Stress: Not on file  Social Connections: Not on file  Intimate Partner Violence: Not on file      Review of systems: All other review of systems negative except as mentioned in the HPI.   Physical Exam: Vitals:   04/28/21 1000  BP: 114/64  Pulse: 76   Body mass index is 30.08 kg/m. Gen:      No acute distress HEENT:  sclera anicteric Abd:      soft, non-tender; no palpable masses, no distension.  Abdominal diastasis recti with ventral hernia Ext:    No edema Neuro: alert and oriented x 3 Psych: normal mood and affect  Data Reviewed:  Reviewed labs, radiology imaging, old records and pertinent past GI work up   Assessment and Plan/Recommendations:  65 year old very pleasant gentleman with history of type 2 diabetes, hypertension, hyperlipidemia, significant CAD on aspirin and Plavix  Solid dysphagia: Patient will benefit from EGD for further evaluation, esophageal biopsies and esophageal dilation, will have to defer invasive procedure until he completes his cardiac work-up and is cleared by cardiology to undergo EGD Will obtain barium esophagram in the interim to  exclude any high risk esophageal lesions  Continue pantoprazole and antireflux measures for GERD  He has history of colon polyps, is due for surveillance colonoscopy.  We will schedule for colonoscopy along with EGD.  We will request cardiology to weigh in if it is okay to hold Plavix for 5 days prior to the procedure  We can proceed with direct EGD and colonoscopy at [facility] after cardiac clearance to undergo the procedures, he will need RN previsit for instructions.  We will request reports of prior EGD and colonoscopy from [facility]  Diastasis recti: Advised patient to use abdominal wall binder,  he was seen by Dr. Windle Guard at [facility] and was referred to physical therapy.  The patient was provided an opportunity to ask questions and all were answered. The patient agreed with the plan and demonstrated an understanding of the instructions.  Damaris Hippo , MD    CC: Philmore Pali, NP

## 2021-05-02 ENCOUNTER — Ambulatory Visit (HOSPITAL_BASED_OUTPATIENT_CLINIC_OR_DEPARTMENT_OTHER): Payer: BC Managed Care – PPO

## 2021-05-02 ENCOUNTER — Ambulatory Visit (HOSPITAL_COMMUNITY): Payer: BC Managed Care – PPO | Attending: Cardiovascular Disease

## 2021-05-02 ENCOUNTER — Other Ambulatory Visit: Payer: Self-pay

## 2021-05-02 ENCOUNTER — Encounter: Payer: Self-pay | Admitting: Physician Assistant

## 2021-05-02 DIAGNOSIS — R072 Precordial pain: Secondary | ICD-10-CM | POA: Diagnosis not present

## 2021-05-02 DIAGNOSIS — I2510 Atherosclerotic heart disease of native coronary artery without angina pectoris: Secondary | ICD-10-CM | POA: Insufficient documentation

## 2021-05-02 DIAGNOSIS — R9431 Abnormal electrocardiogram [ECG] [EKG]: Secondary | ICD-10-CM | POA: Diagnosis not present

## 2021-05-02 LAB — MYOCARDIAL PERFUSION IMAGING
Angina Index: 0
Duke Treadmill Score: 8
Estimated workload: 10.1
Exercise duration (min): 8 min
Exercise duration (sec): 0 s
LV dias vol: 72 mL (ref 62–150)
LV sys vol: 30 mL
MPHR: 153 {beats}/min
Nuc Stress EF: 58 %
Peak HR: 134 {beats}/min
Percent HR: 87 %
Rest HR: 66 {beats}/min
Rest Nuclear Isotope Dose: 10.8 mCi
SDS: 1
SRS: 0
SSS: 1
ST Depression (mm): 0 mm
Stress Nuclear Isotope Dose: 30.8 mCi
TID: 0.78

## 2021-05-02 LAB — ECHOCARDIOGRAM COMPLETE
Area-P 1/2: 3.16 cm2
Height: 71 in
S' Lateral: 2.6 cm
Weight: 3424 oz

## 2021-05-02 MED ORDER — TECHNETIUM TC 99M TETROFOSMIN IV KIT
10.8000 | PACK | Freq: Once | INTRAVENOUS | Status: AC | PRN
  Administered 2021-05-02: 10.8 via INTRAVENOUS
  Filled 2021-05-02: qty 11

## 2021-05-02 MED ORDER — TECHNETIUM TC 99M TETROFOSMIN IV KIT
30.8000 | PACK | Freq: Once | INTRAVENOUS | Status: AC | PRN
  Administered 2021-05-02: 30.8 via INTRAVENOUS
  Filled 2021-05-02: qty 31

## 2021-05-02 MED ORDER — PERFLUTREN LIPID MICROSPHERE
1.0000 mL | INTRAVENOUS | Status: AC | PRN
  Administered 2021-05-02: 2 mL via INTRAVENOUS

## 2021-05-04 ENCOUNTER — Other Ambulatory Visit: Payer: Self-pay

## 2021-05-04 ENCOUNTER — Ambulatory Visit (HOSPITAL_COMMUNITY)
Admission: RE | Admit: 2021-05-04 | Discharge: 2021-05-04 | Disposition: A | Discharge status: Home / Self Care | Payer: BC Managed Care – PPO | Source: Ambulatory Visit | Attending: Gastroenterology | Admitting: Gastroenterology

## 2021-05-04 DIAGNOSIS — R1310 Dysphagia, unspecified: Secondary | ICD-10-CM | POA: Diagnosis present

## 2021-05-11 ENCOUNTER — Other Ambulatory Visit: Payer: Self-pay

## 2021-05-11 ENCOUNTER — Ambulatory Visit: Payer: BC Managed Care – PPO | Attending: Surgery | Admitting: Physical Therapy

## 2021-05-11 DIAGNOSIS — R293 Abnormal posture: Secondary | ICD-10-CM | POA: Diagnosis not present

## 2021-05-11 DIAGNOSIS — M6281 Muscle weakness (generalized): Secondary | ICD-10-CM | POA: Insufficient documentation

## 2021-05-11 NOTE — Patient Instructions (Signed)
Access Code: [email or web address] URL: [email or web address] Date: 05/11/2021 Prepared by: Jari Favre  Exercises Supine Breathing with Hands on Ribcage - 3 x daily - 7 x weekly - 1 sets - 10 reps Dead Bug - 1 x daily - 7 x weekly - 3 sets - 10 reps Dead bug Arms only - 1 x daily - 7 x weekly - 3 sets - 10 reps

## 2021-05-12 NOTE — Therapy (Addendum)
[facility] @ [place], [place], [address] Phone: [phone]   Fax:  [phone]  Physical Therapy Evaluation  Patient Details  Name: Gregory Fox MRN: [id number] Date of Birth: 03-29-1956 Referring Provider (PT): Clovis Riley, MD   Encounter Date: 05/11/2021   PT End of Session - 05/11/21 1350     Visit Number 1    Date for PT Re-Evaluation 08/03/21    Authorization Type BCBS    PT Start Time 1233    PT Stop Time 1751    PT Time Calculation (min) 40 min             Past Medical History:  Diagnosis Date   Anal fissure    CAD (coronary artery disease)    a.  cath in 02/2015 showing 75% Prox-mid Cx, otherwise nonobstructive disease. b. Cath 02/2018 stable from prior -> med rx. // Myoview 10/22: EF 60, no ischemia or infarction; low risk   Colon polyps    GERD (gastroesophageal reflux disease)    Gout    Heart murmur    Echo 10/22: EF 60-65, no RWMA, mild asymmetric LVH, GR 1 DD, normal RVSF, trivial MR, mild AV calcification, mild AV sclerosis without stenosis   Hypercholesteremia    Hypertension    Kidney stones    Taste absent 01/24/2012   "had wisdom tooth pulled; couple days later 2 crowns put in; can't taste since"   Type II diabetes mellitus ([place])     Past Surgical History:  Procedure Laterality Date   CARDIAC CATHETERIZATION N/A 02/25/2015   Procedure: Left Heart Cath and Coronary Angiography;  Surgeon: Belva Crome, MD;  Location: [facility];  Service: Cardiovascular;  Laterality: N/A;   LEFT HEART CATH AND CORONARY ANGIOGRAPHY N/A 03/07/2018   Procedure: LEFT HEART CATH AND CORONARY ANGIOGRAPHY;  Surgeon: Sherren Mocha, MD;  Location: [facility];  Service: Cardiovascular;  Laterality: N/A;   NO PAST SURGERIES      There were no vitals filed for this visit.    Subjective Assessment - 05/11/21 1241     Subjective Pt states he noticed bulging of the abdomen  when sitting up started 6-8 months ago and it has gotten worse over time.  Getting hiccups and some sensation of pain under the ribcage    Limitations Lifting    Currently in Pain? No/denies                [facility] PT Assessment - 05/15/21 0001       Assessment   Medical Diagnosis M62.08 (ICD-10-CM) - Separation of muscle (nontraumatic), other site    Referring Provider (PT) Clovis Riley, MD    Onset Date/Surgical Date --   6-8 months   Prior Therapy No      Precautions   Precautions None      Balance Screen   Has the patient fallen in the past 6 months No      [place] residence    Living Arrangements Spouse/significant other;Children   2 sons     Prior Function   Level of Independence Independent    Vocation Retired    Leisure outdoor activities and yard work      Cognition   Overall Cognitive Status Within Advertising copywriter for tasks assessed      Functional Tests   Functional tests [place]  rounds back not using gluteas and is leaning more forward into knees      Posture/Postural Control   Posture/Postural Control Postural limitations    Postural Limitations Rounded Shoulders   elevated shoulder and ribcage excursion     ROM / Strength   AROM / PROM / Strength AROM;PROM;Strength      AROM   Overall AROM Comments 75% lumbar and thoracic mobility      PROM   Overall PROM Comments hip rotation 60%      Strength   Overall Strength Comments 5/5 bil LE; abdomen 4-/5 MMT unable to withstand resistance      Flexibility   Soft Tissue Assessment /Muscle Length yes    Hamstrings 50%      Palpation   Palpation comment tight throughout spine and gluteals      Ambulation/Gait   Gait Pattern Within Functional Limits                        Objective measurements completed on examination: See above findings.     Pelvic Floor Special Questions - 05/15/21 0001     Urinary Leakage No     Urinary urgency No    Urinary frequency normal              [facility] Adult PT Treatment/Exercise - 05/15/21 0001       Self-Care   Self-Care Other Self-Care Comments    Other Self-Care Comments  diaphragm and ribcage breathing; intitial HEP educated and performed                     PT Education - 05/11/21 1350     Education Details Access Code: [email or web address]    Person(s) Educated Patient    Methods Explanation;Demonstration;Tactile cues;Verbal cues;Handout    Comprehension Verbalized understanding;Returned demonstration                 PT Long Term Goals - 05/15/21 1530       PT LONG TERM GOAL #1   Title Pt will be ind with advanced HEP    Time 12    Period Weeks    Status New    Target Date 08/03/21      PT LONG TERM GOAL #2   Title Pt will be able to sit out of bed without discomfort in abdomen due to improved strength    Time 12    Period Weeks    Status New    Target Date 08/03/21      PT LONG TERM GOAL #3   Title Pt will be able to lift his equipment used for yard work with correct body mechanics to avoid progression of abdominal bulging or discomfort    Time 12    Period Weeks    Status New    Target Date 08/03/21      PT LONG TERM GOAL #4   Title Pt will report no increased discomfort with normal daily activities including doing yard work    Time 12    Period Weeks    Status New    Target Date 08/03/21                    Plan - 05/15/21 1600     Clinical Impression Statement Pt presents to skilled PT due to discomfort in his abdomen from diastasis of rectus abdominus with bulging and tenting of the abdomen of 3 finger width.  Pt has bulging  superior to the umbilicus. Pt is slow with supine to sit and holds his abdomen due to discomfort.  Pt has posture abnormalities as mentioned including shoulder elevation and increased ribcage excursion.  Pt has tension in diaphragm and pectoralis muscles.  Pt has 5/5 MMT bil hips and  abdomen 4-/5.  Pt will benefit from skilled PT to address all above mentioned impairments for reduced risk of complications from diastasis or hernia.    Personal Factors and Comorbidities Comorbidity 2    Comorbidities GERD, dysphagia    Examination-Activity Limitations Bed Mobility;Squat;Lift    Examination-Participation Restrictions Yard Work    Stability/Clinical Decision Making Stable/Uncomplicated    Clinical Decision Making Moderate    Rehab Potential Excellent    PT Frequency 1x / week    PT Duration 12 weeks    PT Treatment/Interventions ADLs/Self Care Home Management;Biofeedback;Cryotherapy;Electrical Stimulation;Moist Heat;Therapeutic activities;Therapeutic exercise;Neuromuscular re-education;Patient/family education;Manual techniques;Passive range of motion;Dry needling;Taping    PT Next Visit Plan back stretches and ribcage mobs, STM to diaphragm and pecs, basic deep core strength    PT Home Exercise Plan [email or web address]    Consulted and Agree with Plan of Care Patient             Patient will benefit from skilled therapeutic intervention in order to improve the following deficits and impairments:  Pain, Postural dysfunction, Impaired flexibility, Increased fascial restricitons, Decreased strength, Decreased coordination, Increased muscle spasms, Decreased range of motion  Visit Diagnosis: Abnormal posture  Muscle weakness (generalized)     Problem List Patient Active Problem List   Diagnosis Date Noted   Coronary artery disease involving native coronary artery with angina pectoris ([place]) 03/07/2018   CAD in native artery 02/25/2015   Pain in the chest    Fever    Type 2 diabetes mellitus with complication (HCC)    Tobacco abuse-smokes and chews 02/09/2015   Chest pain radiating to arm 02/09/2015   Hypercholesteremia    Type II diabetes mellitus ([place])    Skipped heart beats 02/21/2012   GERD (gastroesophageal reflux disease) 01/25/2012   Lipoma of neck 01/25/2012    Hyperlipidemia 01/25/2012   Hypertension 01/25/2012   Chest pain 01/24/2012    Jule Ser, PT 05/15/2021, 4:12 PM  [facility] @ [place], [place], [address] Phone: [phone]   Fax:  [phone]  Name: Gregory Fox MRN: [id number] Date of Birth: 02-03-1956

## 2021-05-15 NOTE — Addendum Note (Signed)
Addended by: Su Hoff on: 05/15/2021 04:16 PM   Modules accepted: Orders

## 2021-05-16 ENCOUNTER — Telehealth: Payer: Self-pay

## 2021-05-16 NOTE — Telephone Encounter (Signed)
-----   Message from Mauri Pole, MD sent at 05/16/2021  3:03 PM EDT ----- Agree to proceed, will need clearance to hold Plavix for 5 days prior to procedure Thanks ----- Message ----- From: Lindon Romp, CMA Sent: 05/16/2021   1:45 PM EDT To: Mauri Pole, MD  Dr. Silverio Decamp, It looks like all the work up with echo is normal. Can I go ahead and schedule procedures and ask clearance for Plavix with Cardiology? ----- Message ----- From: Lindon Romp, CMA Sent: 05/15/2021  12:00 AM EDT To: Lindon Romp, CMA  Patient of Dr. Silverio Decamp that needs ECL scheduled. Patient was currently going through cardiac work up at the time of the appt. He needs to finish work up with echo and other testing. Told him to call office back after he has been cleared from cardiology and he needs to be off Plavix 5 days. Dr. Silverio Decamp said he can be direct.

## 2021-05-16 NOTE — Telephone Encounter (Signed)
Called patient and left message for patient to return my call.

## 2021-05-17 ENCOUNTER — Telehealth: Payer: Self-pay

## 2021-05-17 NOTE — Telephone Encounter (Signed)
   Name: Gregory Fox  DOB: Mar 31, 1956  MRN: [id number]   Primary Cardiologist: Jenkins Rouge, MD  Chart reviewed as part of pre-operative protocol coverage. Patient was contacted 05/17/2021 in reference to pre-operative risk assessment for pending surgery as outlined below.  Gregory Fox was last seen 04/17/21 by Richardson Dopp PA-C. He has a history of diffuse CAD, last cath 2019 with 30% LM, 40% mid-distal LAD 40% ost-mid LAD, 75% prox-mid Cx, 40% mid-distal RCA, 65% distal RCA. Dr. Johnsie Cancel has maintained the patient on long term DAPT with ASA/Plavix. No prior PCI on file. At last OV he was seen for chest pain. Nuclear stress test 05/02/21 was normal and echo with normal EF, mild diastolic dysfunction, mild LVH. I spoke with patient who affirms his prior symptoms have resolved. He is able to exert himself without CP or dyspnea. Therefore, based on ACC/AHA guidelines, the patient would be at acceptable risk for the planned procedure without further cardiovascular testing. The patient was advised that if he develops new symptoms prior to surgery to contact our office to arrange for a follow-up visit, and he verbalized understanding.  I will route to Dr. Johnsie Cancel for clearance to hold Plavix 5 days as requested. Plan to continue ASA. Dr. Johnsie Cancel - Please route response to [facility] PREOP (the pre-op pool). Thank you.  Charlie Pitter, PA-C 05/17/2021, 9:50 AM

## 2021-05-17 NOTE — Telephone Encounter (Signed)
[facility] Pre-operative Risk Assessment     Request for surgical clearance:     Endoscopy Procedure  What type of surgery is being performed?     Plavix  When is this surgery scheduled?     TBD  What type of clearance is required ?   Pharmacy  Are there any medications that need to be held prior to surgery and how long? Plavix x 5 days  Practice name and name of physician performing surgery?      [facility]  What is your office phone and fax number?      Phone- [phone]  [phone]  Anesthesia type (None, local, MAC, general) ?       MAC

## 2021-05-18 ENCOUNTER — Other Ambulatory Visit: Payer: Self-pay

## 2021-05-18 DIAGNOSIS — Z86010 Personal history of colonic polyps: Secondary | ICD-10-CM

## 2021-05-18 DIAGNOSIS — K219 Gastro-esophageal reflux disease without esophagitis: Secondary | ICD-10-CM

## 2021-05-18 DIAGNOSIS — R1310 Dysphagia, unspecified: Secondary | ICD-10-CM

## 2021-05-18 DIAGNOSIS — Z7901 Long term (current) use of anticoagulants: Secondary | ICD-10-CM

## 2021-05-18 DIAGNOSIS — M6208 Separation of muscle (nontraumatic), other site: Secondary | ICD-10-CM

## 2021-05-18 NOTE — Telephone Encounter (Signed)
Patient scheduled for ECL with Dr. Silverio Decamp on 06/23/21 at 3:00pm. Instructions put on my chart and plavix clearance sent to cardiologist. Patient informed to call office if he has any questions but will contact him regarding plavix clearance.Patient verbalized understanding.

## 2021-05-22 ENCOUNTER — Encounter: Payer: Self-pay | Admitting: *Deleted

## 2021-05-22 NOTE — Telephone Encounter (Addendum)
    Patient Name: Gregory Fox  DOB: 08/16/1955 MRN: [id number]  Primary Cardiologist: Jenkins Rouge, MD  Chart reviewed as part of pre-operative protocol coverage. Per Melina Copa, PA-C as documented below and based on ACC/AHA guidelines, [name] would be at acceptable risk for the planned procedure without further cardiovascular testing.   The patient was advised that if he develops new symptoms prior to surgery to contact our office to arrange for a follow-up visit, and he verbalized understanding.  Per Dr. Johnsie Cancel, patient can hold plavix 5 days prior to his upcoming endoscopy when cleared to do so by his gastroenterologist.  I will route this recommendation to the requesting party via [facility] fax function and remove from pre-op pool.  Please call with questions.  Abigail Butts, PA-C 05/22/2021, 8:29 AM

## 2021-05-22 NOTE — Telephone Encounter (Signed)
Patient has been advised that per Dr Kyla Balzarine office, he may hold plavix 5 days prior to his upcoming procedure scheduled 06/23/21. Patient verbalizes clear understanding of this information. In addition, I have given him detailed verbal instructions on Miralax prep and have sent written instructions via mychart and [employer].

## 2021-05-23 ENCOUNTER — Ambulatory Visit: Payer: BC Managed Care – PPO | Attending: Surgery | Admitting: Physical Therapy

## 2021-05-23 ENCOUNTER — Encounter: Payer: Self-pay | Admitting: Physical Therapy

## 2021-05-23 ENCOUNTER — Other Ambulatory Visit: Payer: Self-pay

## 2021-05-23 DIAGNOSIS — M6281 Muscle weakness (generalized): Secondary | ICD-10-CM | POA: Diagnosis present

## 2021-05-23 DIAGNOSIS — R293 Abnormal posture: Secondary | ICD-10-CM | POA: Insufficient documentation

## 2021-05-23 NOTE — Therapy (Signed)
[facility] @ [place], [place], [address] Phone: [phone]   Fax:  [phone]  Physical Therapy Treatment  Patient Details  Name: CURLY MACKOWSKI MRN: [id number] Date of Birth: January 17, 1956 Referring Provider (PT): Clovis Riley, MD   Encounter Date: 05/23/2021   PT End of Session - 05/23/21 0900     Visit Number 2    Date for PT Re-Evaluation 08/03/21    Authorization Type BCBS    PT Start Time 0847    PT Stop Time 0927    PT Time Calculation (min) 40 min    Activity Tolerance Patient tolerated treatment well    Behavior During Therapy [facility] for tasks assessed/performed             Past Medical History:  Diagnosis Date   Anal fissure    CAD (coronary artery disease)    a.  cath in 02/2015 showing 75% Prox-mid Cx, otherwise nonobstructive disease. b. Cath 02/2018 stable from prior -> med rx. // Myoview 10/22: EF 60, no ischemia or infarction; low risk   Colon polyps    GERD (gastroesophageal reflux disease)    Gout    Heart murmur    Echo 10/22: EF 60-65, no RWMA, mild asymmetric LVH, GR 1 DD, normal RVSF, trivial MR, mild AV calcification, mild AV sclerosis without stenosis   Hypercholesteremia    Hypertension    Kidney stones    Taste absent 01/24/2012   "had wisdom tooth pulled; couple days later 2 crowns put in; can't taste since"   Type II diabetes mellitus ([place])     Past Surgical History:  Procedure Laterality Date   CARDIAC CATHETERIZATION N/A 02/25/2015   Procedure: Left Heart Cath and Coronary Angiography;  Surgeon: Belva Crome, MD;  Location: [facility];  Service: Cardiovascular;  Laterality: N/A;   LEFT HEART CATH AND CORONARY ANGIOGRAPHY N/A 03/07/2018   Procedure: LEFT HEART CATH AND CORONARY ANGIOGRAPHY;  Surgeon: Sherren Mocha, MD;  Location: [facility];  Service: Cardiovascular;  Laterality: N/A;   NO PAST SURGERIES      There were no vitals filed for this  visit.   Subjective Assessment - 05/23/21 0856     Subjective I have been doing the exercises and sometimes feel the bulging and sometimes don't.    Currently in Pain? No/denies                               [facility] Adult PT Treatment/Exercise - 05/23/21 0001       Exercises   Exercises Lumbar      Lumbar Exercises: Stretches   Other Lumbar Stretch Exercise thoracic ext on foam noodle; thoracic rotation sidelying; flexion with pball roll out; side bend sitting with pball      Lumbar Exercises: Supine   Other Supine Lumbar Exercises band - blue- horizontal abduction, W, ER - 20x with core braced      Manual Therapy   Manual Therapy Soft tissue mobilization    Soft tissue mobilization upper abdomen and diaphragm - Rt>Lt; pecs                          PT Long Term Goals - 05/23/21 0903       PT LONG TERM GOAL #1   Title Pt will be ind with advanced HEP    Status On-going  PT LONG TERM GOAL #2   Title Pt will be able to sit out of bed without discomfort in abdomen due to improved strength    Status On-going      PT LONG TERM GOAL #3   Title Pt will be able to lift his equipment used for yard work with correct body mechanics to avoid progression of abdominal bulging or discomfort    Status On-going      PT LONG TERM GOAL #4   Title Pt will report no increased discomfort with normal daily activities including doing yard work    Status On-going                   Plan - 05/23/21 1040     Clinical Impression Statement Pt did well with exercises today.  Overall, he is feeling better.  He feels less pain in ribcage and less hiccups.  Pt still feels the bulging and felt it the most today when doing trunk flexion with rotation.  Pt has a lot of tension in his chest and upper traps so added exercises to work on posture and loosening ribcage.  Pt will benefit from skilled PT to continue to progress towards more core strength and trunk  mobility.    PT Treatment/Interventions ADLs/Self Care Home Management;Biofeedback;Cryotherapy;Electrical Stimulation;Moist Heat;Therapeutic activities;Therapeutic exercise;Neuromuscular re-education;Patient/family education;Manual techniques;Passive range of motion;Dry needling;Taping    PT Next Visit Plan ribcage mobs in prone and STM aroud thoracic region; pallof press and quadruped core strengthening; lying on foam roller possibly    PT Home Exercise Plan [email or web address]    Consulted and Agree with Plan of Care Patient             Patient will benefit from skilled therapeutic intervention in order to improve the following deficits and impairments:  Pain, Postural dysfunction, Impaired flexibility, Increased fascial restricitons, Decreased strength, Decreased coordination, Increased muscle spasms, Decreased range of motion  Visit Diagnosis: Abnormal posture  Muscle weakness (generalized)     Problem List Patient Active Problem List   Diagnosis Date Noted   Coronary artery disease involving native coronary artery with angina pectoris ([place]) 03/07/2018   CAD in native artery 02/25/2015   Pain in the chest    Fever    Type 2 diabetes mellitus with complication (HCC)    Tobacco abuse-smokes and chews 02/09/2015   Chest pain radiating to arm 02/09/2015   Hypercholesteremia    Type II diabetes mellitus ([place])    Skipped heart beats 02/21/2012   GERD (gastroesophageal reflux disease) 01/25/2012   Lipoma of neck 01/25/2012   Hyperlipidemia 01/25/2012   Hypertension 01/25/2012   Chest pain 01/24/2012    Camillo Flaming Nikalas Bramel, PT 05/23/2021, 10:53 AM  [facility] @ [place], [place], [address] Phone: [phone]   Fax:  [phone]  Name: IZELL LABAT MRN: [id number] Date of Birth: Nov 08, 1955

## 2021-06-01 ENCOUNTER — Encounter: Payer: BC Managed Care – PPO | Admitting: Physical Therapy

## 2021-06-02 ENCOUNTER — Ambulatory Visit: Payer: BC Managed Care – PPO | Admitting: Physical Therapy

## 2021-06-02 ENCOUNTER — Other Ambulatory Visit: Payer: Self-pay

## 2021-06-02 DIAGNOSIS — M6281 Muscle weakness (generalized): Secondary | ICD-10-CM

## 2021-06-02 DIAGNOSIS — R293 Abnormal posture: Secondary | ICD-10-CM

## 2021-06-02 NOTE — Therapy (Signed)
[facility] @ [place], [place], [address] Phone: [phone]   Fax:  [phone]  Physical Therapy Treatment  Patient Details  Name: [place] MRN: [id number] Date of Birth: 03-19-1956 Referring Provider (PT): Clovis Riley, MD   Encounter Date: 06/02/2021   PT End of Session - 06/02/21 1105     Visit Number 3    Date for PT Re-Evaluation 08/03/21    Authorization Type BCBS    PT Start Time 1020    PT Stop Time 1100    PT Time Calculation (min) 40 min    Activity Tolerance Patient tolerated treatment well    Behavior During Therapy [facility] for tasks assessed/performed             Past Medical History:  Diagnosis Date   Anal fissure    CAD (coronary artery disease)    a.  cath in 02/2015 showing 75% Prox-mid Cx, otherwise nonobstructive disease. b. Cath 02/2018 stable from prior -> med rx. // Myoview 10/22: EF 60, no ischemia or infarction; low risk   Colon polyps    GERD (gastroesophageal reflux disease)    Gout    Heart murmur    Echo 10/22: EF 60-65, no RWMA, mild asymmetric LVH, GR 1 DD, normal RVSF, trivial MR, mild AV calcification, mild AV sclerosis without stenosis   Hypercholesteremia    Hypertension    Kidney stones    Taste absent 01/24/2012   "had wisdom tooth pulled; couple days later 2 crowns put in; can't taste since"   Type II diabetes mellitus ([place])     Past Surgical History:  Procedure Laterality Date   CARDIAC CATHETERIZATION N/A 02/25/2015   Procedure: Left Heart Cath and Coronary Angiography;  Surgeon: Belva Crome, MD;  Location: [facility];  Service: Cardiovascular;  Laterality: N/A;   LEFT HEART CATH AND CORONARY ANGIOGRAPHY N/A 03/07/2018   Procedure: LEFT HEART CATH AND CORONARY ANGIOGRAPHY;  Surgeon: Sherren Mocha, MD;  Location: [facility];  Service: Cardiovascular;  Laterality: N/A;   NO PAST SURGERIES      There were no vitals filed for this  visit.   Subjective Assessment - 06/02/21 1118     Subjective I feel better, and not getting the hiccups as much.    Currently in Pain? No/denies                               [facility] Adult PT Treatment/Exercise - 06/02/21 0001       Neuro Re-ed    Neuro Re-ed Details  exhale with exertion and ribcage depression      Lumbar Exercises: Standing   Shoulder Extension Strengthening;20 reps;Theraband    Shoulder Extension Limitations reverse with shoulder flex - green - 20x    Other Standing Lumbar Exercises pallof press, side step, squat    Other Standing Lumbar Exercises diagonals both ways - pwer tower and bands - 20x      Lumbar Exercises: Supine   Other Supine Lumbar Exercises shoulder ext red band focus on ribcage movements      Manual Therapy   Soft tissue mobilization diaphragm release in sitting - more on Rt side                     PT Education - 06/02/21 1058     Education Details Access Code: [email or web address]  Person(s) Educated Patient    Methods Explanation;Demonstration;Tactile cues;Verbal cues;Handout    Comprehension Verbalized understanding;Returned demonstration                 PT Long Term Goals - 06/02/21 1119       PT LONG TERM GOAL #1   Title Pt will be ind with advanced HEP    Status On-going      PT LONG TERM GOAL #2   Title Pt will be able to sit out of bed without discomfort in abdomen due to improved strength    Baseline don't feel it as much but I still hold the spot to make sure it doesn't bulge out    Status On-going      PT LONG TERM GOAL #3   Title Pt will be able to lift his equipment used for yard work with correct body mechanics to avoid progression of abdominal bulging or discomfort    Baseline working on functional movements      PT LONG TERM GOAL #4   Title Pt will report no increased discomfort with normal daily activities including doing yard work    Baseline hasn't had this week    Status  Partially Met                   Plan - 06/02/21 1113     Clinical Impression Statement Pt reports he is feeling better.  Pt was able to progress core exercises with more focus on obliques and ribcage depression.  Pt tolerated soft tissue work with some release around the Rt diaphragm.  Pt will benefit from skilled PT to continue to address core strength and reduce risk of further progression of hernai and diastasis    PT Treatment/Interventions ADLs/Self Care Home Management;Biofeedback;Cryotherapy;Electrical Stimulation;Moist Heat;Therapeutic activities;Therapeutic exercise;Neuromuscular re-education;Patient/family education;Manual techniques;Passive range of motion;Dry needling;Taping    PT Next Visit Plan lying on foam roller with band work, continue progression of core planks on mat table    PT Home Exercise Plan [email or web address]    Consulted and Agree with Plan of Care Patient             Patient will benefit from skilled therapeutic intervention in order to improve the following deficits and impairments:  Pain, Postural dysfunction, Impaired flexibility, Increased fascial restricitons, Decreased strength, Decreased coordination, Increased muscle spasms, Decreased range of motion  Visit Diagnosis: Abnormal posture  Muscle weakness (generalized)     Problem List Patient Active Problem List   Diagnosis Date Noted   Coronary artery disease involving native coronary artery with angina pectoris ([place]) 03/07/2018   CAD in native artery 02/25/2015   Pain in the chest    Fever    Type 2 diabetes mellitus with complication (HCC)    Tobacco abuse-smokes and chews 02/09/2015   Chest pain radiating to arm 02/09/2015   Hypercholesteremia    Type II diabetes mellitus ([place])    Skipped heart beats 02/21/2012   GERD (gastroesophageal reflux disease) 01/25/2012   Lipoma of neck 01/25/2012   Hyperlipidemia 01/25/2012   Hypertension 01/25/2012   Chest pain 01/24/2012    [place]  Malone Admire, PT 06/02/2021, 11:36 AM  [facility] @ [place], [place], [address] Phone: [phone]   Fax:  [phone]  Name: [place] MRN: [id number] Date of Birth: [birth year]

## 2021-06-02 NOTE — Patient Instructions (Signed)
Access Code: [email or web address] URL: [email or web address] Date: 06/02/2021 Prepared by: Jari Favre  Exercises Supine Breathing with Hands on Ribcage - 3 x daily - 7 x weekly - 1 sets - 10 reps Dead Bug - 1 x daily - 7 x weekly - 3 sets - 10 reps Dead bug Arms only - 1 x daily - 7 x weekly - 3 sets - 10 reps Thoracic Extension Mobilization with Noodle - 1 x daily - 7 x weekly - 3 sets - 10 reps Sidelying Thoracic Lumbar Rotation - 1 x daily - 7 x weekly - 1 sets - 5 reps - 10 sec hold Supine Shoulder Horizontal Abduction with Resistance - 1 x daily - 7 x weekly - 3 sets - 10 reps Shoulder external rotation - 1 x daily - 7 x weekly - 3 sets - 10 reps Standing Bilateral Shoulder Flexion Reactive Isometric with Resistance - 1 x daily - 7 x weekly - 3 sets - 10 reps Standing Diagonal Lift with Anchored Resistance - 1 x daily - 7 x weekly - 3 sets - 10 reps Anti-Rotation Sidestepping with Resistance - 1 x daily - 7 x weekly - 3 sets - 10 reps

## 2021-06-08 ENCOUNTER — Encounter: Payer: BC Managed Care – PPO | Admitting: Physical Therapy

## 2021-06-09 ENCOUNTER — Encounter: Payer: Self-pay | Admitting: Gastroenterology

## 2021-06-13 ENCOUNTER — Encounter: Payer: Self-pay | Admitting: Physical Therapy

## 2021-06-13 ENCOUNTER — Other Ambulatory Visit: Payer: Self-pay

## 2021-06-13 ENCOUNTER — Ambulatory Visit: Payer: BC Managed Care – PPO | Admitting: Physical Therapy

## 2021-06-13 DIAGNOSIS — M6281 Muscle weakness (generalized): Secondary | ICD-10-CM

## 2021-06-13 DIAGNOSIS — R293 Abnormal posture: Secondary | ICD-10-CM

## 2021-06-13 NOTE — Therapy (Signed)
[facility] @ [place], [place], [address] Phone: [phone]   Fax:  [phone]  Physical Therapy Treatment  Patient Details  Name: [place] MRN: [id number] Date of Birth: Dec 08, 1955 Referring Provider (PT): Clovis Riley, MD   Encounter Date: 06/13/2021   PT End of Session - 06/13/21 0756     Visit Number 4    Date for PT Re-Evaluation 08/03/21    Authorization Type BCBS    PT Start Time 0758    PT Stop Time 0838    PT Time Calculation (min) 40 min    Activity Tolerance Patient tolerated treatment well    Behavior During Therapy [facility] for tasks assessed/performed             Past Medical History:  Diagnosis Date   Anal fissure    CAD (coronary artery disease)    a.  cath in 02/2015 showing 75% Prox-mid Cx, otherwise nonobstructive disease. b. Cath 02/2018 stable from prior -> med rx. // Myoview 10/22: EF 60, no ischemia or infarction; low risk   Colon polyps    GERD (gastroesophageal reflux disease)    Gout    Heart murmur    Echo 10/22: EF 60-65, no RWMA, mild asymmetric LVH, GR 1 DD, normal RVSF, trivial MR, mild AV calcification, mild AV sclerosis without stenosis   Hypercholesteremia    Hypertension    Kidney stones    Taste absent 01/24/2012   "had wisdom tooth pulled; couple days later 2 crowns put in; can't taste since"   Type II diabetes mellitus ([place])     Past Surgical History:  Procedure Laterality Date   CARDIAC CATHETERIZATION N/A 02/25/2015   Procedure: Left Heart Cath and Coronary Angiography;  Surgeon: Belva Crome, MD;  Location: [facility];  Service: Cardiovascular;  Laterality: N/A;   LEFT HEART CATH AND CORONARY ANGIOGRAPHY N/A 03/07/2018   Procedure: LEFT HEART CATH AND CORONARY ANGIOGRAPHY;  Surgeon: Sherren Mocha, MD;  Location: [facility];  Service: Cardiovascular;  Laterality: N/A;   NO PAST SURGERIES      There were no vitals filed for this  visit.   Subjective Assessment - 06/13/21 0844     Subjective Still feeling better, overall still feel the bulging when I press up from bed    Limitations Lifting    Currently in Pain? No/denies                               [facility] Adult PT Treatment/Exercise - 06/13/21 0001       Lumbar Exercises: Machines for Strengthening   Other Lumbar Machine Exercise lat 40lb - 20    Other Lumbar Machine Exercise lat pull single arm 10lb each side standing      Lumbar Exercises: Seated   Other Seated Lumbar Exercises press up on arm of chair end range lat press      Lumbar Exercises: Supine   Other Supine Lumbar Exercises lying supine on foam roller - green band - shoulder ext, UE alt, LE alt march and bent knee fall out, diagonals    Other Supine Lumbar Exercises thoracic mobs lying in supine      Lumbar Exercises: Quadruped   Straight Leg Raise 10 reps    Opposite Arm/Leg Raise Left arm/Right leg;Right arm/Left leg;10 reps  PT Long Term Goals - 06/02/21 1119       PT LONG TERM GOAL #1   Title Pt will be ind with advanced HEP    Status On-going      PT LONG TERM GOAL #2   Title Pt will be able to sit out of bed without discomfort in abdomen due to improved strength    Baseline don't feel it as much but I still hold the spot to make sure it doesn't bulge out    Status On-going      PT LONG TERM GOAL #3   Title Pt will be able to lift his equipment used for yard work with correct body mechanics to avoid progression of abdominal bulging or discomfort    Baseline working on functional movements      PT LONG TERM GOAL #4   Title Pt will report no increased discomfort with normal daily activities including doing yard work    Baseline hasn't had this week    Status Partially Met                   Plan - 06/13/21 0842     Clinical Impression Statement Pt states overall still feeling better.  Pt added more  challenging exercises today and no pressure other than pressing up of mat table.  Pt states he is only feeling the bulge when he gets up out of bed.  Pt will benefit from skilled PT to continue to address core strength for improved functional activities.    PT Treatment/Interventions ADLs/Self Care Home Management;Biofeedback;Cryotherapy;Electrical Stimulation;Moist Heat;Therapeutic activities;Therapeutic exercise;Neuromuscular re-education;Patient/family education;Manual techniques;Passive range of motion;Dry needling;Taping    PT Next Visit Plan progress qped, lat pull, overhead press    PT Home Exercise Plan [email or web address]    Consulted and Agree with Plan of Care Patient             Patient will benefit from skilled therapeutic intervention in order to improve the following deficits and impairments:  Pain, Postural dysfunction, Impaired flexibility, Increased fascial restricitons, Decreased strength, Decreased coordination, Increased muscle spasms, Decreased range of motion  Visit Diagnosis: Abnormal posture  Muscle weakness (generalized)     Problem List Patient Active Problem List   Diagnosis Date Noted   Coronary artery disease involving native coronary artery with angina pectoris ([place]) 03/07/2018   CAD in native artery 02/25/2015   Pain in the chest    Fever    Type 2 diabetes mellitus with complication (HCC)    Tobacco abuse-smokes and chews 02/09/2015   Chest pain radiating to arm 02/09/2015   Hypercholesteremia    Type II diabetes mellitus ([place])    Skipped heart beats 02/21/2012   GERD (gastroesophageal reflux disease) 01/25/2012   Lipoma of neck 01/25/2012   Hyperlipidemia 01/25/2012   Hypertension 01/25/2012   Chest pain 01/24/2012    Camillo Flaming Tayron Hunnell, PT 06/13/2021, 8:45 AM  [facility] @ [place], [place], [address] Phone: [phone]   Fax:  [phone]  Name: [place] MRN:  [id number] Date of Birth: 1955-09-11

## 2021-06-15 ENCOUNTER — Other Ambulatory Visit: Payer: Self-pay | Admitting: Cardiovascular Disease

## 2021-06-22 ENCOUNTER — Encounter: Payer: BC Managed Care – PPO | Admitting: Physical Therapy

## 2021-06-23 ENCOUNTER — Ambulatory Visit (AMBULATORY_SURGERY_CENTER): Payer: BC Managed Care – PPO | Admitting: Gastroenterology

## 2021-06-23 ENCOUNTER — Encounter: Payer: Self-pay | Admitting: Gastroenterology

## 2021-06-23 VITALS — BP 111/78 | HR 64 | Temp 95.7°F | Resp 15 | Ht 70.0 in | Wt 214.0 lb

## 2021-06-23 DIAGNOSIS — K222 Esophageal obstruction: Secondary | ICD-10-CM | POA: Diagnosis not present

## 2021-06-23 DIAGNOSIS — Z86010 Personal history of colonic polyps: Secondary | ICD-10-CM

## 2021-06-23 DIAGNOSIS — K317 Polyp of stomach and duodenum: Secondary | ICD-10-CM

## 2021-06-23 DIAGNOSIS — R1310 Dysphagia, unspecified: Secondary | ICD-10-CM

## 2021-06-23 MED ORDER — FAMOTIDINE 20 MG PO TABS: 20.0000 mg | ORAL_TABLET | Freq: Every day | ORAL | 3 refills | Status: DC

## 2021-06-23 MED ORDER — SODIUM CHLORIDE 0.9 % IV SOLN: 500.0000 mL | Freq: Once | INTRAVENOUS | Status: DC

## 2021-06-23 NOTE — Progress Notes (Signed)
D.T. vital signs. °

## 2021-06-23 NOTE — Op Note (Signed)
[facility] Patient Name: [place] Procedure Date: 06/23/2021 3:20 PM MRN: [id number] Endoscopist: Mauri Pole , MD Age: 65 Referring MD:  Date of Birth: 12/05/1955 Gender: Male Account #: [id number] Procedure:                Colonoscopy Indications:              High risk colon cancer surveillance: Personal                            history of colonic polyps, Screening in patient at                            increased risk: Family history of 1st-degree                            relative with colorectal cancer Medicines:                Monitored Anesthesia Care Procedure:                Pre-Anesthesia Assessment:                           - Prior to the procedure, a History and Physical                            was performed, and patient medications and                            allergies were reviewed. The patient's tolerance of                            previous anesthesia was also reviewed. The risks                            and benefits of the procedure and the sedation                            options and risks were discussed with the patient.                            All questions were answered, and informed consent                            was obtained. Prior Anticoagulants: The patient                            last took Plavix (clopidogrel) 2 days prior to the                            procedure. ASA Grade Assessment: III - A patient                            with severe systemic disease. After reviewing the  risks and benefits, the patient was deemed in                            satisfactory condition to undergo the procedure.                           After obtaining informed consent, the colonoscope                            was passed under direct vision. Throughout the                            procedure, the patient's blood pressure, pulse, and                            oxygen saturations were  monitored continuously. The                            Olympus PCF-H190DL (#[id number]) Colonoscope was                            introduced through the anus and advanced to the the                            cecum, identified by appendiceal orifice and                            ileocecal valve. The colonoscopy was performed                            without difficulty. The patient tolerated the                            procedure well. Scope In: 3:45:40 PM Scope Out: 4:04:28 PM Scope Withdrawal Time: 0 hours 10 minutes 56 seconds  Total Procedure Duration: 0 hours 18 minutes 48 seconds  Findings:                 The perianal and digital rectal examinations were                            normal.                           Scattered small and large-mouthed diverticula were                            found in the sigmoid colon and descending colon.                           Non-bleeding external and internal hemorrhoids were                            found during retroflexion. The hemorrhoids were  medium-sized. Complications:            No immediate complications. Estimated Blood Loss:     Estimated blood loss was minimal. Impression:               - Diverticulosis in the sigmoid colon and in the                            descending colon.                           - Non-bleeding external and internal hemorrhoids.                           - No specimens collected. Recommendation:           - Resume previous diet.                           - Continue present medications.                           - Resume Plavix (clopidogrel) at prior dose today.                           - Repeat colonoscopy in 5 years for surveillance. Mauri Pole, MD 06/23/2021 4:20:16 PM This report has been signed electronically.

## 2021-06-23 NOTE — Progress Notes (Signed)
Pt's states no medical or surgical changes since previsit or office visit. 

## 2021-06-23 NOTE — Progress Notes (Signed)
VSS, transported to PACU °

## 2021-06-23 NOTE — Op Note (Signed)
[facility] Patient Name: Gregory Fox Procedure Date: 06/23/2021 3:21 PM MRN: [id number] Endoscopist: Mauri Pole , MD Age: 65 Referring MD:  Date of Birth: 07-24-1955 Gender: Male Account #: [id number] Procedure:                Upper GI endoscopy Indications:              Dysphagia Medicines:                Monitored Anesthesia Care Procedure:                Pre-Anesthesia Assessment:                           - Prior to the procedure, a History and Physical                            was performed, and patient medications and                            allergies were reviewed. The patient's tolerance of                            previous anesthesia was also reviewed. The risks                            and benefits of the procedure and the sedation                            options and risks were discussed with the patient.                            All questions were answered, and informed consent                            was obtained. Prior Anticoagulants: The patient                            last took Plavix (clopidogrel) 2 days prior to the                            procedure. ASA Grade Assessment: III - A patient                            with severe systemic disease. After reviewing the                            risks and benefits, the patient was deemed in                            satisfactory condition to undergo the procedure.                           After obtaining informed consent, the endoscope was  passed under direct vision. Throughout the                            procedure, the patient's blood pressure, pulse, and                            oxygen saturations were monitored continuously. The                            Endoscope was introduced through the mouth, and                            advanced to the second part of duodenum. The upper                            GI endoscopy was accomplished without  difficulty.                            The patient tolerated the procedure well. Scope In: Scope Out: Findings:                 A non-obstructing Schatzki ring was found at the                            gastroesophageal junction.                           The Z-line was regular and was found 40 cm from the                            incisors.                           The exam of the esophagus was otherwise normal.                           Multiple large pedunculated and sessile fundic                            gland polyps with no bleeding and no stigmata of                            recent bleeding were found in the gastric fundus                            and in the gastric body. Biopsies were taken with a                            cold forceps for histology.                           The cardia and gastric fundus were normal on                            retroflexion.  The examined duodenum was normal. Complications:            No immediate complications. Estimated Blood Loss:     Estimated blood loss was minimal. Impression:               - Non-obstructing Schatzki ring.                           - Z-line regular, 40 cm from the incisors.                           - Multiple fundic gland polyps. Biopsied.                           - Normal examined duodenum. Recommendation:           - Resume previous diet.                           - Continue present medications.                           - Await pathology results. Mauri Pole, MD 06/23/2021 4:24:17 PM This report has been signed electronically.

## 2021-06-23 NOTE — Progress Notes (Signed)
Called to room to assist during endoscopic procedure.  Patient ID and intended procedure confirmed with present staff. Received instructions for my participation in the procedure from the performing physician.  

## 2021-06-23 NOTE — Progress Notes (Signed)
[name] History and Physical   Primary Care Physician:  Philmore Pali, NP   Reason for Procedure:  Dysphagia, history of adenomatous colon polyps and family h/o colon cancer  Plan:    EGD and colonoscopy with possible interventions as needed     HPI: Gregory Fox is a very pleasant 65 y.o. male here for EGD and colonoscopy. Denies any nausea, vomiting, abdominal pain, melena or bright red blood per rectum  The risks and benefits as well as alternatives of endoscopic procedure(s) have been discussed and reviewed. All questions answered. The patient agrees to proceed.  Please refer to office visit note 04/28/21 for additional details  Last dose of Plavix was 2 days ago  Past Medical History:  Diagnosis Date   Anal fissure    CAD (coronary artery disease)    a.  cath in 02/2015 showing 75% Prox-mid Cx, otherwise nonobstructive disease. b. Cath 02/2018 stable from prior -> med rx. // Myoview 10/22: EF 60, no ischemia or infarction; low risk   Colon polyps    GERD (gastroesophageal reflux disease)    Gout    Heart murmur    Echo 10/22: EF 60-65, no RWMA, mild asymmetric LVH, GR 1 DD, normal RVSF, trivial MR, mild AV calcification, mild AV sclerosis without stenosis   Hypercholesteremia    Hypertension    Kidney stones    Taste absent 01/24/2012   "had wisdom tooth pulled; couple days later 2 crowns put in; can't taste since"   Type II diabetes mellitus ([place])     Past Surgical History:  Procedure Laterality Date   CARDIAC CATHETERIZATION N/A 02/25/2015   Procedure: Left Heart Cath and Coronary Angiography;  Surgeon: Belva Crome, MD;  Location: [facility];  Service: Cardiovascular;  Laterality: N/A;   LEFT HEART CATH AND CORONARY ANGIOGRAPHY N/A 03/07/2018   Procedure: LEFT HEART CATH AND CORONARY ANGIOGRAPHY;  Surgeon: Sherren Mocha, MD;  Location: [facility];  Service: Cardiovascular;  Laterality: N/A;   NO PAST SURGERIES      Prior to  Admission medications   Medication Sig Start Date End Date Taking? Authorizing Provider  aspirin EC 81 MG tablet Take 81 mg by mouth daily.   Yes [provider]  atorvastatin (LIPITOR) 40 MG tablet Take 40 mg by mouth at bedtime. 08/19/19  Yes [provider]  BD PEN NEEDLE MICRO U/F 32G X 6 MM MISC  09/02/19  Yes [provider]  carvedilol (COREG) 25 MG tablet Take 1 tablet (25 mg total) by mouth 2 (two) times daily. Please call to schedule follow up appointment. 09/06/20  Yes Josue Hector, MD  KRILL OIL PO Take 1 tablet by mouth daily.   Yes [provider]  losartan-hydrochlorothiazide (HYZAAR) 50-12.5 MG tablet TAKE 1 TABLET BY MOUTH EVERY DAY 06/19/21  Yes Richardson Dopp T, PA-C  metFORMIN (GLUCOPHAGE-XR) 500 MG 24 hr tablet Take 1,500 mg by mouth every evening.  04/16/17  Yes [provider]  ONETOUCH VERIO test strip USE TO CHECK BLOOD GLUCOSE SUGAR TWICE A DAY BEFORE MEALS 50 05/20/19  Yes [provider]  pantoprazole (PROTONIX) 40 MG tablet Take 1 tablet (40 mg total) by mouth daily. 03/03/15  Yes Weaver, Scott T, PA-C  TRESIBA FLEXTOUCH 200 UNIT/ML SOPN Inject 50 Units as directed at bedtime. 03/15/17  Yes [provider]  TRULICITY 1.5 ME/2.6ST SOPN Inject 1.5 mg as directed every Sunday.  03/17/17  Yes [provider]  clopidogrel (  PLAVIX) 75 MG tablet TAKE 1 TABLET (75 MG TOTAL) BY MOUTH DAILY. 03/13/17   Josue Hector, MD  nitroGLYCERIN (NITROSTAT) 0.4 MG SL tablet Take 1 tablet under your tongue, while sitting for chest pain. If no relief of pain may take one tab every 5 minutes up to 3 tabs total over 15 min. If no relief CALL 911. Patient not taking: Reported on 04/28/2021 09/04/19   Josue Hector, MD  Olopatadine HCl 0.2 % SOLN Apply 1 drop to eye 2 (two) times daily. 09/23/19   [provider]    Current Outpatient Medications  Medication Sig Dispense Refill   aspirin EC 81 MG tablet Take 81 mg by  mouth daily.     atorvastatin (LIPITOR) 40 MG tablet Take 40 mg by mouth at bedtime.     BD PEN NEEDLE MICRO U/F 32G X 6 MM MISC      carvedilol (COREG) 25 MG tablet Take 1 tablet (25 mg total) by mouth 2 (two) times daily. Please call to schedule follow up appointment. 180 tablet 1   KRILL OIL PO Take 1 tablet by mouth daily.     losartan-hydrochlorothiazide (HYZAAR) 50-12.5 MG tablet TAKE 1 TABLET BY MOUTH EVERY DAY 90 tablet 2   metFORMIN (GLUCOPHAGE-XR) 500 MG 24 hr tablet Take 1,500 mg by mouth every evening.      ONETOUCH VERIO test strip USE TO CHECK BLOOD GLUCOSE SUGAR TWICE A DAY BEFORE MEALS 50     pantoprazole (PROTONIX) 40 MG tablet Take 1 tablet (40 mg total) by mouth daily. 30 tablet 11   TRESIBA FLEXTOUCH 200 UNIT/ML SOPN Inject 50 Units as directed at bedtime.     TRULICITY 1.5 ZO/1.0RU SOPN Inject 1.5 mg as directed every Sunday.      clopidogrel (PLAVIX) 75 MG tablet TAKE 1 TABLET (75 MG TOTAL) BY MOUTH DAILY. 30 tablet 5   nitroGLYCERIN (NITROSTAT) 0.4 MG SL tablet Take 1 tablet under your tongue, while sitting for chest pain. If no relief of pain may take one tab every 5 minutes up to 3 tabs total over 15 min. If no relief CALL 911. (Patient not taking: Reported on 04/28/2021) 25 tablet 3   Olopatadine HCl 0.2 % SOLN Apply 1 drop to eye 2 (two) times daily.     Current Facility-Administered Medications  Medication Dose Route Frequency Provider Last Rate Last Admin   0.9 %  sodium chloride infusion  500 mL Intravenous Once Mauri Pole, MD        Allergies as of 06/23/2021 - Review Complete 06/23/2021  Allergen Reaction Noted   Sulfa antibiotics Rash 01/24/2012    Family History  Problem Relation Age of Onset   Breast cancer Mother    Ovarian cancer Mother    Cervical cancer Mother    Heart disease Mother    Hypertension Mother    CAD Father    Colon cancer Father 51   Hypertension Father    Colon cancer Sister        colostomy bag   Colon cancer Maternal  Grandfather    Diabetes Son    Autism Son    Hyperlipidemia Son    Hypertension Son     Social History   Socioeconomic History   Marital status: Married    Spouse name: Not on file   Number of children: 2   Years of education: Not on file   Highest education level: Not on file  Occupational History   Occupation: retired  Tobacco Use   Smoking status: Former    Packs/day: 0.25    Types: Cigarettes    Quit date: 2002    Years since quitting: 20.9   Smokeless tobacco: Current    Types: Snuff  Vaping Use   Vaping Use: Never used  Substance and Sexual Activity   Alcohol use: Yes    Alcohol/week: 7.0 standard drinks    Types: 7 Cans of beer per week    Comment: beer once every 3 weeks   Drug use: No   Sexual activity: Not on file  Other Topics Concern   Not on file  Social History Narrative   Not on file   Social Determinants of Health   Financial Resource Strain: Not on file  Food Insecurity: Not on file  Transportation Needs: Not on file  Physical Activity: Not on file  Stress: Not on file  Social Connections: Not on file  Intimate Partner Violence: Not on file    Review of Systems:  All other review of systems negative except as mentioned in the HPI.  Physical Exam: Vital signs in last 24 hours: BP (!) 104/58   Pulse 68   Temp (!) 95.7 F (35.4 C)   Ht 5\' 10"  (1.778 m)   Wt 214 lb (97.1 kg)   SpO2 98%   BMI 30.71 kg/m  General:   Alert, NAD Lungs:  Clear .   Heart:  Regular rate and rhythm Abdomen:  Soft, nontender and nondistended. Neuro/Psych:  Alert and cooperative. Normal mood and affect. A and O x 3  Reviewed labs, radiology imaging, old records and pertinent past GI work up  Patient is appropriate for planned procedure(s) and anesthesia in an ambulatory setting   K. Denzil Magnuson , MD [phone]

## 2021-06-23 NOTE — Patient Instructions (Signed)
Resume Plavix at prior dose today.  Handout on diverticulosis given.    YOU HAD AN ENDOSCOPIC PROCEDURE TODAY AT [facility]:   Refer to the procedure report that was given to you for any specific questions about what was found during the examination.  If the procedure report does not answer your questions, please call your gastroenterologist to clarify.  If you requested that your care partner not be given the details of your procedure findings, then the procedure report has been included in a sealed envelope for you to review at your convenience later.  YOU SHOULD EXPECT: Some feelings of bloating in the abdomen. Passage of more gas than usual.  Walking can help get rid of the air that was put into your GI tract during the procedure and reduce the bloating. If you had a lower endoscopy (such as a colonoscopy or flexible sigmoidoscopy) you may notice spotting of blood in your stool or on the toilet paper. If you underwent a bowel prep for your procedure, you may not have a normal bowel movement for a few days.  Please Note:  You might notice some irritation and congestion in your nose or some drainage.  This is from the oxygen used during your procedure.  There is no need for concern and it should clear up in a day or so.  SYMPTOMS TO REPORT IMMEDIATELY:  Following lower endoscopy (colonoscopy or flexible sigmoidoscopy):  Excessive amounts of blood in the stool  Significant tenderness or worsening of abdominal pains  Swelling of the abdomen that is new, acute  Fever of 100F or higher  Following upper endoscopy (EGD)  Vomiting of blood or coffee ground material  New chest pain or pain under the shoulder blades  Painful or persistently difficult swallowing  New shortness of breath  Fever of 100F or higher  Black, tarry-looking stools  For urgent or emergent issues, a gastroenterologist can be reached at any hour by calling [phone]. Do not use MyChart messaging for  urgent concerns.    DIET:  We do recommend a small meal at first, but then you may proceed to your regular diet.  Drink plenty of fluids but you should avoid alcoholic beverages for 24 hours.  ACTIVITY:  You should plan to take it easy for the rest of today and you should NOT DRIVE or use heavy machinery until tomorrow (because of the sedation medicines used during the test).    FOLLOW UP: Our staff will call the number listed on your records 48-72 hours following your procedure to check on you and address any questions or concerns that you may have regarding the information given to you following your procedure. If we do not reach you, we will leave a message.  We will attempt to reach you two times.  During this call, we will ask if you have developed any symptoms of COVID 19. If you develop any symptoms (ie: fever, flu-like symptoms, shortness of breath, cough etc.) before then, please call [phone].  If you test positive for Covid 19 in the 2 weeks post procedure, please call and report this information to [place].    If any biopsies were taken you will be contacted by phone or by letter within the next 1-3 weeks.  Please call us at [phone] if you have not heard about the biopsies in 3 weeks.    SIGNATURES/CONFIDENTIALITY: You and/or your care partner have signed paperwork which will be entered into your electronic medical record.  These signatures attest to the fact that that the information above on your After Visit Summary has been reviewed and is understood.  Full responsibility of the confidentiality of this discharge information lies with you and/or your care-partner.  

## 2021-06-27 ENCOUNTER — Telehealth: Payer: Self-pay | Admitting: *Deleted

## 2021-06-27 ENCOUNTER — Telehealth: Payer: Self-pay

## 2021-06-27 NOTE — Telephone Encounter (Signed)
Spoke with the patient and arranged a follow up appointment with Dr Silverio Decamp. Also advised the patient to contact Dr Windle Guard to return to care for the abdominal wall hernia. Office note faxed to [facility].

## 2021-06-27 NOTE — Telephone Encounter (Signed)
Attempted to reach patient for post-procedure f/u call. No answer. Left message that staff will make another attempt to reach him later today and for him to please not hesitate to call us if he has any questions/concerns regarding his care. 

## 2021-06-27 NOTE — Telephone Encounter (Signed)
  Follow up Call-  Call back number 06/23/2021  Post procedure Call Back phone  # [phone]  Permission to leave phone message Yes  Some recent data might be hidden   LMOM to call back with any questions or concerns.  Also, call back if patient has developed fever, respiratory issues or been dx with COVID or had any family members or close contacts diagnosed since her procedure.

## 2021-06-28 ENCOUNTER — Telehealth: Payer: Self-pay | Admitting: Gastroenterology

## 2021-06-28 NOTE — Telephone Encounter (Signed)
Patient called and is seeking advise about his medications. States Dr. Silverio Decamp put him on Pepcid, patient wants to know if he needs to stop taking Pantoprazole or to take both medications. Please advise.  [phone]

## 2021-06-29 ENCOUNTER — Encounter: Payer: Self-pay | Admitting: Physical Therapy

## 2021-06-29 ENCOUNTER — Ambulatory Visit: Payer: BC Managed Care – PPO | Attending: Surgery | Admitting: Physical Therapy

## 2021-06-29 ENCOUNTER — Other Ambulatory Visit: Payer: Self-pay

## 2021-06-29 DIAGNOSIS — M6281 Muscle weakness (generalized): Secondary | ICD-10-CM | POA: Diagnosis present

## 2021-06-29 DIAGNOSIS — R293 Abnormal posture: Secondary | ICD-10-CM | POA: Insufficient documentation

## 2021-06-29 NOTE — Therapy (Signed)
[facility] [address] [place], [place], [address] Phone: [phone]   Fax:  [phone]  Physical Therapy Treatment  Patient Details  Name: Gregory Fox MRN: [id number] Date of Birth: [birth year] Referring Provider (PT): Clovis Riley, MD   Encounter Date: 06/29/2021   PT End of Session - 06/29/21 0805     Visit Number 6    Date for PT Re-Evaluation 08/03/21    Authorization Type BCBS    PT Start Time 0759    PT Stop Time 0838    PT Time Calculation (min) 39 min    Activity Tolerance Patient tolerated treatment well    Behavior During Therapy [facility] for tasks assessed/performed             Past Medical History:  Diagnosis Date   Anal fissure    CAD (coronary artery disease)    a.  cath in 02/2015 showing 75% Prox-mid Cx, otherwise nonobstructive disease. b. Cath 02/2018 stable from prior -> med rx. // Myoview 10/22: EF 60, no ischemia or infarction; low risk   Colon polyps    GERD (gastroesophageal reflux disease)    Gout    Heart murmur    Echo 10/22: EF 60-65, no RWMA, mild asymmetric LVH, GR 1 DD, normal RVSF, trivial MR, mild AV calcification, mild AV sclerosis without stenosis   Hypercholesteremia    Hypertension    Kidney stones    Taste absent 01/24/2012   "had wisdom tooth pulled; couple days later 2 crowns put in; can't taste since"   Type II diabetes mellitus ([place])     Past Surgical History:  Procedure Laterality Date   CARDIAC CATHETERIZATION N/A 02/25/2015   Procedure: Left Heart Cath and Coronary Angiography;  Surgeon: Belva Crome, MD;  Location: [facility];  Service: Cardiovascular;  Laterality: N/A;   LEFT HEART CATH AND CORONARY ANGIOGRAPHY N/A 03/07/2018   Procedure: LEFT HEART CATH AND CORONARY ANGIOGRAPHY;  Surgeon: Sherren Mocha, MD;  Location: [facility];  Service: Cardiovascular;  Laterality: N/A;   NO PAST SURGERIES      There were no vitals filed for this  visit.   Subjective Assessment - 06/29/21 0803     Subjective I dont' have the bad feeling as much anymore    Currently in Pain? No/denies                               [facility] Adult PT Treatment/Exercise - 06/29/21 0001       Lumbar Exercises: Aerobic   Nustep L3 x 6 min satus      Lumbar Exercises: Machines for Strengthening   Other Lumbar Machine Exercise lat pull 45lb - 20      Lumbar Exercises: Standing   Lifting From waist;20 reps   green band lifting to shoulder height   Shoulder Extension Power Tower;Both;20 reps   10lb   Shoulder Extension Limitations added tricep 10lb x 20      Lumbar Exercises: Quadruped   Single Arm Raise 10 reps    Single Arm Raises Limitations knees on gray disc    Straight Leg Raise 10 reps    Straight Leg Raises Limitations hands on gray disc      Manual Therapy   Soft tissue mobilization diaphragm release in supine - more on Rt side  PT Long Term Goals - 06/29/21 4268       PT LONG TERM GOAL #1   Title Pt will be ind with advanced HEP    Status On-going      PT LONG TERM GOAL #2   Title Pt will be able to sit out of bed without discomfort in abdomen due to improved strength    Baseline not uncomfortable anymore, but does have a bulge    Status Achieved                   Plan - 06/29/21 1104     Clinical Impression Statement Pt is doing better and did well with exercise progression at home.  Pt able to add challenge to quadruped and added weight to lat pull down.  Needed some cues to exhale with lifting. Pt had some release with massage to diaphragm and still hold some tension through diaphragm bil.  Pt will benefit from skilled PT to re-assess and probably finalize HEP next visit.    PT Treatment/Interventions ADLs/Self Care Home Management;Biofeedback;Cryotherapy;Electrical Stimulation;Moist Heat;Therapeutic activities;Therapeutic exercise;Neuromuscular  re-education;Patient/family education;Manual techniques;Passive range of motion;Dry needling;Taping    PT Next Visit Plan final HEP and k-tape if he wanted to try that    PT Home Exercise Plan [id number]    Consulted and Agree with Plan of Care Patient             Patient will benefit from skilled therapeutic intervention in order to improve the following deficits and impairments:  Pain, Postural dysfunction, Impaired flexibility, Increased fascial restricitons, Decreased strength, Decreased coordination, Increased muscle spasms, Decreased range of motion  Visit Diagnosis: Abnormal posture  Muscle weakness (generalized)     Problem List Patient Active Problem List   Diagnosis Date Noted   Coronary artery disease involving native coronary artery with angina pectoris ([place]) 03/07/2018   CAD in native artery 02/25/2015   Pain in the chest    Fever    Type 2 diabetes mellitus with complication ([place])    Tobacco abuse-smokes and chews 02/09/2015   Chest pain radiating to arm 02/09/2015   Hypercholesteremia    Type II diabetes mellitus ([place])    Skipped heart beats 02/21/2012   GERD (gastroesophageal reflux disease) 01/25/2012   Lipoma of neck 01/25/2012   Hyperlipidemia 01/25/2012   Hypertension 01/25/2012   Chest pain 01/24/2012    Camillo Flaming Terique Kawabata, PT 06/29/2021, 12:28 PM  [facility] @ [address], [place], [address] Phone: [phone]   Fax:  [phone]  Name: Gregory Fox MRN: [id number] Date of Birth: 1956/05/22

## 2021-07-03 NOTE — Telephone Encounter (Signed)
Spoke with pt and he is aware of Dr. Woodward Ku recommendations. Pt already has an OV scheduled.

## 2021-07-03 NOTE — Telephone Encounter (Signed)
Please advise patient to use pantoprazole in the morning before breakfast and use Pepcid in the evening after dinner as needed.  Follow-up in office visit next available appointment in 2 months.  Thanks

## 2021-07-04 ENCOUNTER — Encounter: Payer: Self-pay | Admitting: Gastroenterology

## 2021-07-06 ENCOUNTER — Encounter: Payer: Self-pay | Admitting: Physical Therapy

## 2021-07-06 ENCOUNTER — Ambulatory Visit: Payer: BC Managed Care – PPO | Admitting: Physical Therapy

## 2021-07-06 ENCOUNTER — Other Ambulatory Visit: Payer: Self-pay

## 2021-07-06 DIAGNOSIS — M6281 Muscle weakness (generalized): Secondary | ICD-10-CM

## 2021-07-06 DIAGNOSIS — R293 Abnormal posture: Secondary | ICD-10-CM | POA: Diagnosis not present

## 2021-07-06 NOTE — Therapy (Signed)
[facility] @ [address] [place], [place], [address] Phone: [phone]   Fax:  [phone]  Physical Therapy Treatment  Patient Details  Name: MOREY ANDONIAN MRN: [id number] Date of Birth: [birth year] Referring Provider (PT): Clovis Riley, MD   Encounter Date: 07/06/2021   PT End of Session - 07/06/21 0803     Visit Number 7    Date for PT Re-Evaluation 08/03/21    Authorization Type BCBS    PT Start Time 0759    PT Stop Time 0838    PT Time Calculation (min) 39 min    Activity Tolerance Patient tolerated treatment well    Behavior During Therapy [facility] for tasks assessed/performed             Past Medical History:  Diagnosis Date   Anal fissure    CAD (coronary artery disease)    a.  cath in 02/2015 showing 75% Prox-mid Cx, otherwise nonobstructive disease. b. Cath 02/2018 stable from prior -> med rx. // Myoview 10/22: EF 60, no ischemia or infarction; low risk   Colon polyps    GERD (gastroesophageal reflux disease)    Gout    Heart murmur    Echo 10/22: EF 60-65, no RWMA, mild asymmetric LVH, GR 1 DD, normal RVSF, trivial MR, mild AV calcification, mild AV sclerosis without stenosis   Hypercholesteremia    Hypertension    Kidney stones    Taste absent 01/24/2012   "had wisdom tooth pulled; couple days later 2 crowns put in; can't taste since"   Type II diabetes mellitus ([place])     Past Surgical History:  Procedure Laterality Date   CARDIAC CATHETERIZATION N/A 02/25/2015   Procedure: Left Heart Cath and Coronary Angiography;  Surgeon: Belva Crome, MD;  Location: [facility];  Service: Cardiovascular;  Laterality: N/A;   LEFT HEART CATH AND CORONARY ANGIOGRAPHY N/A 03/07/2018   Procedure: LEFT HEART CATH AND CORONARY ANGIOGRAPHY;  Surgeon: Sherren Mocha, MD;  Location: [place] [facility] [facility];  Service: Cardiovascular;  Laterality: N/A;   NO PAST SURGERIES      There were no vitals filed for this  visit.   Subjective Assessment - 07/06/21 0802     Subjective I feel about 70-80% better and more aware of things    Currently in Pain? No/denies                               [facility] Adult PT Treatment/Exercise - 07/06/21 0001       Lumbar Exercises: Aerobic   Nustep L3 x 6 min satus      Lumbar Exercises: Machines for Strengthening   Other Lumbar Machine Exercise lat pull 50lb - 20      Lumbar Exercises: Standing   Lifting From waist;20 reps   green band lifting to shoulder height   Lifting Weights (lbs) forward press with power tower 5lb 20x      Lumbar Exercises: Supine   Bridge Limitations bridge with pball - 20x    Straight Leg Raise 10 reps    Straight Leg Raises Limitations 10 reps each side; pball under legs    Other Supine Lumbar Exercises lying on foam roller: bent knee fall out, red ball overhead, small march,      Lumbar Exercises: Quadruped   Single Arm Raise Right;Left;10 reps   red band   Other Quadruped Lumbar Exercises fire hydrants  10x each side    Other Quadruped Lumbar Exercises qped on forearms LE kick and rocking - 10x each                          PT Long Term Goals - 07/06/21 0853       PT LONG TERM GOAL #1   Title Pt will be ind with advanced HEP    Status Achieved      PT LONG TERM GOAL #2   Title Pt will be able to sit out of bed without discomfort in abdomen due to improved strength    Status Achieved      PT LONG TERM GOAL #3   Title Pt will be able to lift his equipment used for yard work with correct body mechanics to avoid progression of abdominal bulging or discomfort    Status Achieved      PT LONG TERM GOAL #4   Title Pt will report no increased discomfort with normal daily activities including doing yard work    Baseline hasn't had this lately    Status Achieved                   Plan - 07/06/21 0804     Clinical Impression Statement Pt is 70-80% better and is independent with  home exercises.  He is recommended to continue with HEP and d/c from therapy today    PT Treatment/Interventions ADLs/Self Care Home Management;Biofeedback;Cryotherapy;Electrical Stimulation;Moist Heat;Therapeutic activities;Therapeutic exercise;Neuromuscular re-education;Patient/family education;Manual techniques;Passive range of motion;Dry needling;Taping    PT Next Visit Plan d/c    PT Home Exercise Plan [id number]    Consulted and Agree with Plan of Care Patient             Patient will benefit from skilled therapeutic intervention in order to improve the following deficits and impairments:  Pain, Postural dysfunction, Impaired flexibility, Increased fascial restricitons, Decreased strength, Decreased coordination, Increased muscle spasms, Decreased range of motion  Visit Diagnosis: Abnormal posture  Muscle weakness (generalized)     Problem List Patient Active Problem List   Diagnosis Date Noted   Coronary artery disease involving native coronary artery with angina pectoris ([place]) 03/07/2018   CAD in native artery 02/25/2015   Pain in the chest    Fever    Type 2 diabetes mellitus with complication (HCC)    Tobacco abuse-smokes and chews 02/09/2015   Chest pain radiating to arm 02/09/2015   Hypercholesteremia    Type II diabetes mellitus ([place])    Skipped heart beats 02/21/2012   GERD (gastroesophageal reflux disease) 01/25/2012   Lipoma of neck 01/25/2012   Hyperlipidemia 01/25/2012   Hypertension 01/25/2012   Chest pain 01/24/2012    Camillo Flaming Melanny Wire, PT 07/06/2021, 8:54 AM  [facility] @ [address] [place], [place], [address] Phone: [phone]   Fax:  [phone]  Name: JAIS DEMIR MRN: [id number] Date of Birth: [birth year] PHYSICAL THERAPY DISCHARGE SUMMARY  Visits from Start of Care: 7  Current functional level related to goals / functional outcomes: See above goals   Remaining deficits: See  Jethro Poling details   Education / Equipment: HEP  Patient agrees to discharge. Patient goals were met. Patient is being discharged due to meeting the stated rehab goals.  Gustavus Bryant, PT 07/06/21 8:55 AM

## 2021-07-06 NOTE — Telephone Encounter (Signed)
[facility] called to advise they are not able to proceed with referral sent by our office. They said she has diastasis recti and the patient does not have a hernia she was referred by Dr. Windle Guard in September for physical therapy.

## 2021-07-06 NOTE — Telephone Encounter (Signed)
FYI

## 2021-07-26 ENCOUNTER — Encounter: Payer: Self-pay | Admitting: Gastroenterology

## 2021-07-26 ENCOUNTER — Ambulatory Visit: Payer: BC Managed Care – PPO | Admitting: Gastroenterology

## 2021-07-26 VITALS — BP 110/70 | HR 70 | Ht 70.0 in | Wt 221.0 lb

## 2021-07-26 DIAGNOSIS — K439 Ventral hernia without obstruction or gangrene: Secondary | ICD-10-CM | POA: Diagnosis not present

## 2021-07-26 DIAGNOSIS — M6208 Separation of muscle (nontraumatic), other site: Secondary | ICD-10-CM | POA: Diagnosis not present

## 2021-07-26 DIAGNOSIS — K219 Gastro-esophageal reflux disease without esophagitis: Secondary | ICD-10-CM | POA: Diagnosis not present

## 2021-07-26 DIAGNOSIS — I2510 Atherosclerotic heart disease of native coronary artery without angina pectoris: Secondary | ICD-10-CM | POA: Diagnosis not present

## 2021-07-26 MED ORDER — PANTOPRAZOLE SODIUM 40 MG PO TBEC: 40.0000 mg | DELAYED_RELEASE_TABLET | Freq: Every day | ORAL | 11 refills | Status: DC

## 2021-07-26 NOTE — Progress Notes (Signed)
Gregory Fox    [id number]    [birth year]  Primary Care Physician:Lam, Rudi Rummage, NP  Referring Physician: Philmore Pali, NP [place],  [place] [address]   Chief complaint:  Dysphagia, GERD  HPI: 66 year old very pleasant gentleman here for follow-up visit for GERD and dysphagia  Overall his symptoms have improved significantly, he is no longer having frequent GERD symptoms.  He is taking pantoprazole 40 mg daily No recent episodes of dysphagia or food impaction.  He is intentionally trying to lose weight  Denies any nausea, vomiting, abdominal pain, melena or bright red blood per rectum    Colonoscopy June 23, 2021 - Diverticulosis in the sigmoid colon and in the descending colon. - Non-bleeding external and internal hemorrhoids. - No specimens collected.  EGD June 23, 2021 - A non-obstructing Schatzki ring was found at the gastroesophageal junction. - The Z-line was regular and was found 40 cm from the incisors. - The exam of the esophagus was otherwise normal. - Multiple large pedunculated and sessile fundic gland polyps with no bleeding and no stigmata of recent bleeding were found in the gastric fundus and in the gastric body. Biopsies were taken with a cold forceps for histology showed benign fundic gland polyps, negative for H. pylori bacterial infection - The cardia and gastric fundus were normal on retroflexion. - The examined duodenum was normal.   Outpatient Encounter Medications as of 07/26/2021  Medication Sig   aspirin EC 81 MG tablet Take 81 mg by mouth daily.   atorvastatin (LIPITOR) 40 MG tablet Take 40 mg by mouth at bedtime.   BD PEN NEEDLE MICRO U/F 32G X 6 MM MISC    carvedilol (COREG) 25 MG tablet Take 1 tablet (25 mg total) by mouth 2 (two) times daily. Please call to schedule follow up appointment.   clopidogrel (PLAVIX) 75 MG tablet TAKE 1 TABLET (75 MG TOTAL) BY MOUTH DAILY.   famotidine (PEPCID) 20 MG tablet Take  1 tablet (20 mg total) by mouth at bedtime.   KRILL OIL PO Take 1 tablet by mouth daily.   losartan-hydrochlorothiazide (HYZAAR) 50-12.5 MG tablet TAKE 1 TABLET BY MOUTH EVERY DAY   metFORMIN (GLUCOPHAGE-XR) 500 MG 24 hr tablet Take 1,500 mg by mouth every evening.    nitroGLYCERIN (NITROSTAT) 0.4 MG SL tablet Take 1 tablet under your tongue, while sitting for chest pain. If no relief of pain may take one tab every 5 minutes up to 3 tabs total over 15 min. If no relief CALL 911.   Olopatadine HCl 0.2 % SOLN Apply 1 drop to eye 2 (two) times daily.   ONETOUCH VERIO test strip USE TO CHECK BLOOD GLUCOSE SUGAR TWICE A DAY BEFORE MEALS 50   pantoprazole (PROTONIX) 40 MG tablet Take 1 tablet (40 mg total) by mouth daily.   TRESIBA FLEXTOUCH 200 UNIT/ML SOPN Inject 50 Units as directed at bedtime.   TRULICITY 1.5 SN/0.5LZ SOPN Inject 1.5 mg as directed every Sunday.    No facility-administered encounter medications on file as of 07/26/2021.    Allergies as of 07/26/2021 - Review Complete 07/26/2021  Allergen Reaction Noted   Sulfa antibiotics Rash 01/24/2012    Past Medical History:  Diagnosis Date   Anal fissure    CAD (coronary artery disease)    a.  cath in 02/2015 showing 75% Prox-mid Cx, otherwise nonobstructive disease. b. Cath 02/2018 stable from prior -> med rx. // Myoview  10/22: EF 60, no ischemia or infarction; low risk   Colon polyps    GERD (gastroesophageal reflux disease)    Gout    Heart murmur    Echo 10/22: EF 60-65, no RWMA, mild asymmetric LVH, GR 1 DD, normal RVSF, trivial MR, mild AV calcification, mild AV sclerosis without stenosis   Hypercholesteremia    Hypertension    Kidney stones    Taste absent 01/24/2012   "had wisdom tooth pulled; couple days later 2 crowns put in; can't taste since"   Type II diabetes mellitus ([place])     Past Surgical History:  Procedure Laterality Date   CARDIAC CATHETERIZATION N/A 02/25/2015   Procedure: Left Heart Cath and Coronary  Angiography;  Surgeon: Belva Crome, MD;  Location: [facility];  Service: Cardiovascular;  Laterality: N/A;   LEFT HEART CATH AND CORONARY ANGIOGRAPHY N/A 03/07/2018   Procedure: LEFT HEART CATH AND CORONARY ANGIOGRAPHY;  Surgeon: Sherren Mocha, MD;  Location: [facility];  Service: Cardiovascular;  Laterality: N/A;   NO PAST SURGERIES      Family History  Problem Relation Age of Onset   Breast cancer Mother    Ovarian cancer Mother    Cervical cancer Mother    Heart disease Mother    Hypertension Mother    CAD Father    Colon cancer Father 70   Hypertension Father    Colon cancer Sister        colostomy bag   Colon cancer Maternal Grandfather    Diabetes Son    Autism Son    Hyperlipidemia Son    Hypertension Son     Social History   Socioeconomic History   Marital status: Married    Spouse name: Not on file   Number of children: 2   Years of education: Not on file   Highest education level: Not on file  Occupational History   Occupation: retired  Tobacco Use   Smoking status: Former    Packs/day: 0.25    Types: Cigarettes    Quit date: 2002    Years since quitting: 21.0   Smokeless tobacco: Current    Types: Snuff  Vaping Use   Vaping Use: Never used  Substance and Sexual Activity   Alcohol use: Yes    Alcohol/week: 7.0 standard drinks    Types: 7 Cans of beer per week    Comment: beer once every 3 weeks   Drug use: No   Sexual activity: Not on file  Other Topics Concern   Not on file  Social History Narrative   Not on file   Social Determinants of Health   Financial Resource Strain: Not on file  Food Insecurity: Not on file  Transportation Needs: Not on file  Physical Activity: Not on file  Stress: Not on file  Social Connections: Not on file  Intimate Partner Violence: Not on file      Review of systems: All other review of systems negative except as mentioned in the HPI.   Physical Exam: Vitals:   07/26/21 0852  BP:  110/70  Pulse: 70   Body mass index is 31.71 kg/m. Gen:      No acute distress HEENT:  sclera anicteric Abd:      soft, non-tender; no palpable masses, no distension Ext:    No edema Neuro: alert and oriented x 3 Psych: normal mood and affect  Data Reviewed:  Reviewed labs, radiology imaging, old records and pertinent past GI  work up   Assessment and Plan/Recommendations:  66 year old very pleasant gentleman with chronic GERD, benign Schatzki's ring and intermittent dysphagia Overall symptoms have improved Continue omeprazole 40 mg daily and antireflux measures  CAD: Follow-up with cardiology, is on chronic anti-platelet therapy  Abdominal wall hernia with rectus diastasis, advised patient to avoid lifting heavy weights and to use abdominal wall binder.  He is doing physical therapy and has noticed improvement in his abdominal wall muscle strength.  He is also intentionally trying to lose weight.  Return in 1 year   The patient was provided an opportunity to ask questions and all were answered. The patient agreed with the plan and demonstrated an understanding of the instructions.  Damaris Hippo , MD    CC: Philmore Pali, NP

## 2021-07-26 NOTE — Patient Instructions (Signed)
We have sent Pantorazole refills to your pharmacy, after 3 months you can [place] as needed  Follow up in 1 year  If you are age 68 or older, your body mass index should be between 23-30. Your Body mass index is 31.71 kg/m. If this is out of the aforementioned range listed, please consider follow up with your Primary Care Provider.  If you are age 31 or younger, your body mass index should be between 19-25. Your Body mass index is 31.71 kg/m. If this is out of the aformentioned range listed, please consider follow up with your Primary Care Provider.   ________________________________________________________  The [facility] providers would like to encourage you to use [facility] to communicate with providers for non-urgent requests or questions.  Due to long hold times on the telephone, sending your provider a message by [facility] may be a faster and more efficient way to get a response.  Please allow 48 business hours for a response.  Please remember that this is for non-urgent requests.  _______________________________________________________   I appreciate the  opportunity to care for you  Thank You   Harl Bowie , MD

## 2021-07-27 ENCOUNTER — Encounter: Payer: Self-pay | Admitting: Gastroenterology

## 2021-08-14 DIAGNOSIS — I4510 Unspecified right bundle-branch block: Secondary | ICD-10-CM | POA: Insufficient documentation

## 2021-08-14 NOTE — Progress Notes (Signed)
Cardiology Office Note:    Date:  08/15/2021   ID:  Gregory Fox, DOB 10-26-1955, MRN [id number]  PCP:  [name], MD  [facility] Providers Cardiologist:  Jenkins Rouge, MD    Referring MD: Philmore Pali, NP   Chief Complaint:  F/u for CAD    Patient Profile: Coronary artery disease  Cath 8/19: mod LCx dz (prox 75), mild non-obstr dz in RCA, LAD >> Med Rx  ETT: 10/18: normal Myoview 8/16: no ischemia, low risk  Hypertension  Hyperlipidemia  Diabetes mellitus  GERD  Prior CV Studies: Echocardiogram 05/02/21 EF 60-65, no RWMA, mild asymmetric LVH, Gr 1 DD, normal RVSF, trivial MR, AV sclerosis w/o AS  Myoview  Normal stress nuclear study with no ischemia or infarction.  Gated ejection fraction 60% with normal wall motion.  Cardiac catheterization 03/07/18 LM 30 LAD ost 40, mid 40 LCx prox 75 RCA mid 56, dist 12 Med Rx  ETT 04/25/17 Neg GXT  Myoview 03/04/15 No ischemia, EF 50; low risk  ECHOCARDIOGRAM 02/10/15 EF 65-70, normal wall motion, GRII DD, mild LAE, normal RVSF    History of Present Illness:   Gregory Fox is a 66 y.o. male with the above problem list.  He was last seen in 9/22.  He had a lot of chest pain and symptoms of acid reflux.  Right Bundle Branch Block was new on his EKG.   I obtained an echocardiogram which showed normal LVF and a Myoview was low risk and neg for ischemia.  He saw GI and had an EGD.  He was last seen in 1/23 with improved symptoms.  He returns for f/u.  He notes that he has a ventral hernia and this has caused a lot of symptoms for him.  He has not had any further chest pain.  He has not had shortness of breath, syncope, leg edema.  Overall, he is doing well.      Past Medical History:  Diagnosis Date   Anal fissure    CAD (coronary artery disease)    a.  cath in 02/2015 showing 75% Prox-mid Cx, otherwise nonobstructive disease. b. Cath 02/2018 stable from prior -> med rx. // Myoview 10/22: EF 60, no ischemia or  infarction; low risk   Colon polyps    GERD (gastroesophageal reflux disease)    Gout    Heart murmur    Echo 10/22: EF 60-65, no RWMA, mild asymmetric LVH, GR 1 DD, normal RVSF, trivial MR, mild AV calcification, mild AV sclerosis without stenosis   Hypercholesteremia    Hypertension    Kidney stones    Taste absent 01/24/2012   "had wisdom tooth pulled; couple days later 2 crowns put in; can't taste since"   Type II diabetes mellitus (HCC)    Current Medications: Current Meds  Medication Sig   atorvastatin (LIPITOR) 40 MG tablet Take 40 mg by mouth at bedtime.   BD PEN NEEDLE MICRO U/F 32G X 6 MM MISC    carvedilol (COREG) 25 MG tablet Take 1 tablet (25 mg total) by mouth 2 (two) times daily. Please call to schedule follow up appointment.   clopidogrel (PLAVIX) 75 MG tablet TAKE 1 TABLET (75 MG TOTAL) BY MOUTH DAILY.   famotidine (PEPCID) 20 MG tablet Take 1 tablet (20 mg total) by mouth at bedtime.   KRILL OIL PO Take 1 tablet by mouth daily.   losartan-hydrochlorothiazide (HYZAAR) 50-12.5 MG tablet TAKE 1 TABLET BY MOUTH EVERY DAY  metFORMIN (GLUCOPHAGE-XR) 500 MG 24 hr tablet Take 1,500 mg by mouth every evening.    nitroGLYCERIN (NITROSTAT) 0.4 MG SL tablet Take 1 tablet under your tongue, while sitting for chest pain. If no relief of pain may take one tab every 5 minutes up to 3 tabs total over 15 min. If no relief CALL 911.   Olopatadine HCl 0.2 % SOLN Apply 1 drop to eye 2 (two) times daily.   ONETOUCH VERIO test strip USE TO CHECK BLOOD GLUCOSE SUGAR TWICE A DAY BEFORE MEALS 50   pantoprazole (PROTONIX) 40 MG tablet Take 1 tablet (40 mg total) by mouth daily.   TRESIBA FLEXTOUCH 200 UNIT/ML SOPN Inject 50 Units as directed at bedtime.   TRULICITY 1.5 KV/4.2VZ SOPN Inject 1.5 mg as directed every Sunday.     Allergies:   Sulfa antibiotics   Social History   Tobacco Use   Smoking status: Former    Packs/day: 0.25    Types: Cigarettes    Quit date: 2002    Years since  quitting: 21.0   Smokeless tobacco: Current    Types: Snuff  Vaping Use   Vaping Use: Never used  Substance Use Topics   Alcohol use: Yes    Alcohol/week: 7.0 standard drinks    Types: 7 Cans of beer per week    Comment: beer once every 3 weeks   Drug use: No    Family Hx: The patient's family history includes Autism in his son; Breast cancer in his mother; CAD in his father; Cervical cancer in his mother; Colon cancer in his maternal grandfather and sister; Colon cancer (age of onset: 58) in his father; Diabetes in his son; Heart disease in his mother; Hyperlipidemia in his son; Hypertension in his father, mother, and son; Ovarian cancer in his mother.  ROS see HPI  EKGs/Labs/Other Test Reviewed:    EKG:  EKG is not ordered today.  The ekg ordered today demonstrates n/a  Recent Labs: No results found for requested labs within last 8760 hours.   Recent Lipid Panel No results for input(s): CHOL, TRIG, HDL, VLDL, LDLCALC, LDLDIRECT in the last 8760 hours.   Risk Assessment/Calculations:         Physical Exam:    VS:  BP 128/74 (BP Location: Left Arm, Patient Position: Sitting, Cuff Size: Normal)    Pulse 75    Ht 5\' 11"  (1.803 m)    Wt 221 lb 3.2 oz (100.3 kg)    SpO2 96%    BMI 30.85 kg/m     Wt Readings from Last 3 Encounters:  08/15/21 221 lb 3.2 oz (100.3 kg)  07/26/21 221 lb (100.2 kg)  06/23/21 214 lb (97.1 kg)    Constitutional:      Appearance: Healthy appearance. Not in distress.  Neck:     Vascular: No carotid bruit.  Pulmonary:     Effort: Pulmonary effort is normal.     Breath sounds: No wheezing. No rales.  Cardiovascular:     Normal rate. Regular rhythm. Normal S1. Normal S2.      Murmurs: There is no murmur.  Edema:    Peripheral edema absent.  Abdominal:     Palpations: Abdomen is soft.  Musculoskeletal:     Cervical back: Neck supple. Skin:    General: Skin is warm and dry.  Neurological:     General: No focal deficit present.     Mental  Status: Alert and oriented to person, place and time.  Cranial Nerves: Cranial nerves are intact.         ASSESSMENT & PLAN:   CAD (coronary artery disease) Moderate nonobstructive disease by cardiac catheterization in 2019.  Recent Myoview obtained due to symptoms of chest pain and right bundle branch block on EKG was low risk.  Echocardiogram demonstrated normal LV function.  He is doing well without anginal symptoms.  He does have significant acid reflux.  Therefore, I have asked him to stop aspirin.  He can remain on clopidogrel 75 mg daily.  Continue atorvastatin.  Follow-up in 1 year.  RBBB (right bundle branch block) Recent echocardiogram with normal EF.  Nuclear stress test low risk.  No further work-up needed.  Hypertension The patient's blood pressure is controlled on his current regimen.  Continue current therapy.   Hypercholesteremia Recent LDL optimal.  He remains on atorvastatin 40 mg daily.         Dispo:  Return in about 1 year (around 08/15/2022) for Routine follow up in 1 year with Dr. Johnsie Cancel. .   Medication Adjustments/Labs and Tests Ordered: Current medicines are reviewed at length with the patient today.  Concerns regarding medicines are outlined above.  Tests Ordered: No orders of the defined types were placed in this encounter.  Medication Changes: No orders of the defined types were placed in this encounter.  Signed, Richardson Dopp, PA-C  08/15/2021 12:16 PM    [facility] [place], [place], [place]  [address] Phone: [phone]; Fax: [phone]

## 2021-08-15 ENCOUNTER — Other Ambulatory Visit: Payer: Self-pay

## 2021-08-15 ENCOUNTER — Ambulatory Visit: Payer: BC Managed Care – PPO | Admitting: Physician Assistant

## 2021-08-15 ENCOUNTER — Encounter: Payer: Self-pay | Admitting: Physician Assistant

## 2021-08-15 VITALS — BP 128/74 | HR 75 | Ht 71.0 in | Wt 221.2 lb

## 2021-08-15 DIAGNOSIS — E7800 Pure hypercholesterolemia, unspecified: Secondary | ICD-10-CM

## 2021-08-15 DIAGNOSIS — I4510 Unspecified right bundle-branch block: Secondary | ICD-10-CM

## 2021-08-15 DIAGNOSIS — I2510 Atherosclerotic heart disease of native coronary artery without angina pectoris: Secondary | ICD-10-CM | POA: Diagnosis not present

## 2021-08-15 DIAGNOSIS — I10 Essential (primary) hypertension: Secondary | ICD-10-CM

## 2021-08-15 NOTE — Patient Instructions (Signed)
Medication Instructions:   Your physician recommends that you continue on your current medications as directed. Please refer to the Current Medication list given to you today.  *If you need a refill on your cardiac medications before your next appointment, please call your pharmacy*   Lab Work:  None ordered.  If you have labs (blood work) drawn today and your tests are completely normal, you will receive your results only by: [place] (if you have MyChart) OR A paper copy in the mail If you have any lab test that is abnormal or we need to change your treatment, we will call you to review the results.   Testing/Procedures:  None ordered.   Follow-Up: At [facility], you and your health needs are our priority.  As part of our continuing mission to provide you with exceptional heart care, we have created designated Provider Care Teams.  These Care Teams include your primary Cardiologist (physician) and Advanced Practice Providers (APPs -  Physician Assistants and Nurse Practitioners) who all work together to provide you with the care you need, when you need it.  We recommend signing up for the patient portal called "MyChart".  Sign up information is provided on this After Visit Summary.  MyChart is used to connect with patients for Virtual Visits (Telemedicine).  Patients are able to view lab/test results, encounter notes, upcoming appointments, etc.  Non-urgent messages can be sent to your provider as well.   To learn more about what you can do with MyChart, go to [email or web address].    Your next appointment:   1 year(s)  The format for your next appointment:   In Person  Provider:   Jenkins Rouge, MD     Other Instructions  Your physician wants you to follow-up in: 1 year with Dr. Johnsie Cancel.  You will receive a reminder letter in the mail two months in advance. If you don't receive a letter, please call our office to schedule the follow-up appointment.

## 2021-08-15 NOTE — Assessment & Plan Note (Signed)
Recent echocardiogram with normal EF.  Nuclear stress test low risk.  No further work-up needed.

## 2021-08-15 NOTE — Assessment & Plan Note (Signed)
Recent LDL optimal.  He remains on atorvastatin 40 mg daily.

## 2021-08-15 NOTE — Assessment & Plan Note (Signed)
Moderate nonobstructive disease by cardiac catheterization in 2019.  Recent Myoview obtained due to symptoms of chest pain and right bundle branch block on EKG was low risk.  Echocardiogram demonstrated normal LV function.  He is doing well without anginal symptoms.  He does have significant acid reflux.  Therefore, I have asked him to stop aspirin.  He can remain on clopidogrel 75 mg daily.  Continue atorvastatin.  Follow-up in 1 year.

## 2021-08-15 NOTE — Assessment & Plan Note (Signed)
The patient's blood pressure is controlled on his current regimen.  Continue current therapy.  

## 2021-09-23 ENCOUNTER — Other Ambulatory Visit: Payer: Self-pay | Admitting: Gastroenterology

## 2022-05-19 ENCOUNTER — Other Ambulatory Visit: Payer: Self-pay | Admitting: Physician Assistant

## 2022-08-02 IMAGING — RF DG ESOPHAGUS
10 of 12 series · 16 of 24 positions shown · non-contrast
Comparison: NONE.

CLINICAL DATA: 65-year-old male with history of dysphagia.

EXAM:
ESOPHAGUS/BARIUM SWALLOW/TABLET STUDY
TECHNIQUE: Combined double and single contrast examination was performed using
effervescent crystals, high-density barium, and thin liquid barium.
This exam was performed by Mafaz Alva, and was supervised and
interpreted by Dr. Sarisha.
FLUOROSCOPY TIME:  Radiation Exposure Index (as provided by the
fluoroscopic device): 1.6 minutes
If the device does not provide the exposure index:
Fluoroscopy Time:  21.7 mGy

[Series 1: cp_standard · 0.34mm/px · 2 of 39 frames shown (1 of 10)]
[frame 6/39]
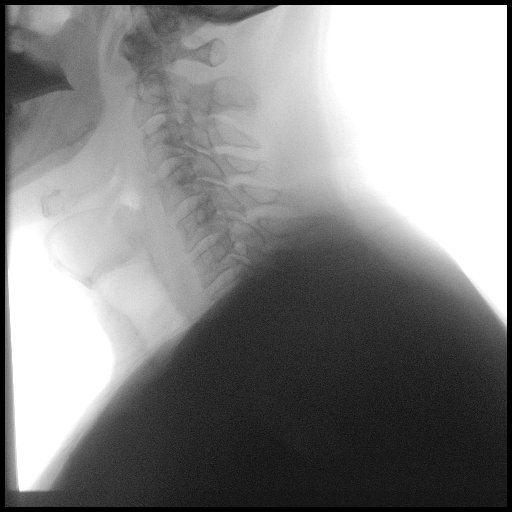
[frame 34/39]
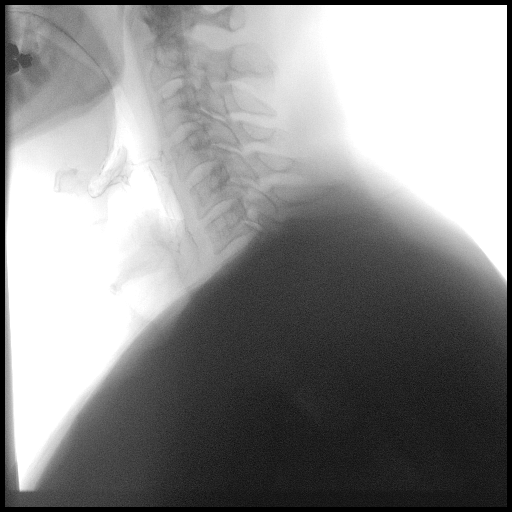

[Series 2: cp_standard · 0.51mm/px · 1 of 22 frames shown (2 of 10)]
[frame 4/22]
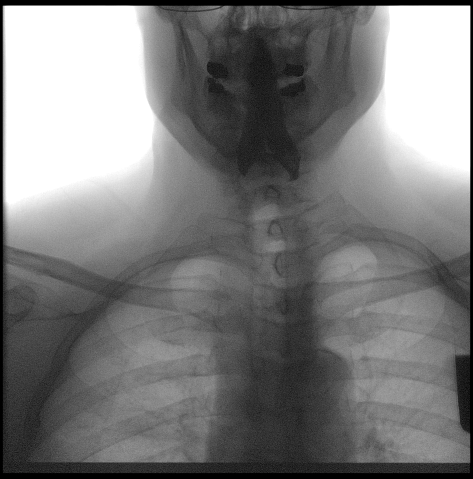

[Series 3: cp_standard · 0.51mm/px · 2 of 67 frames shown (3 of 10)]
[frame 11/67]
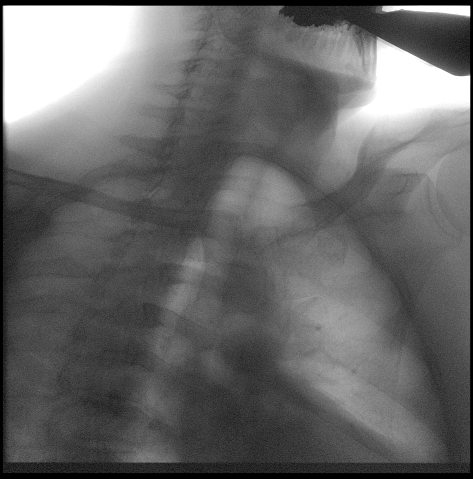
[frame 36/67]
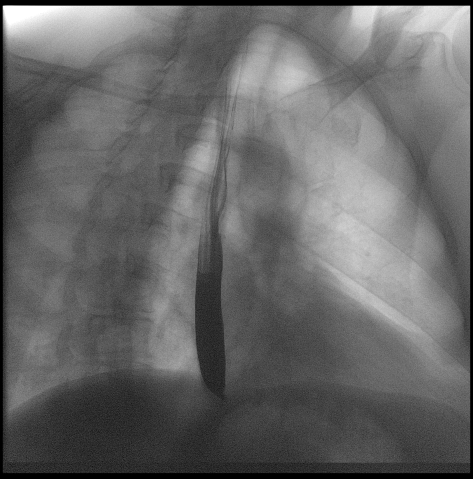

[Series 5: cp_standard · 0.51mm/px · 2 of 25 frames shown (4 of 10)]
[frame 4/25]
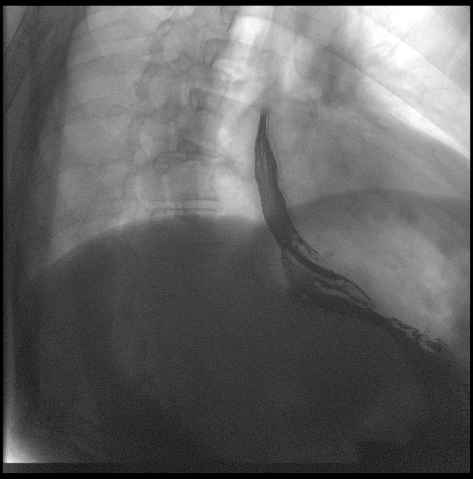
[frame 13/25]
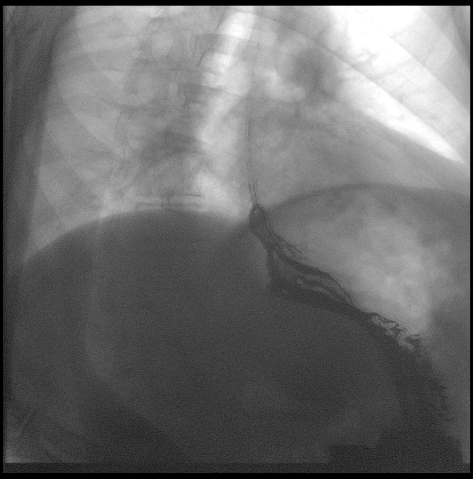

[Series 6: cp_standard · 0.51mm/px · 2 of 53 frames shown (5 of 10)]
[frame 39/53]
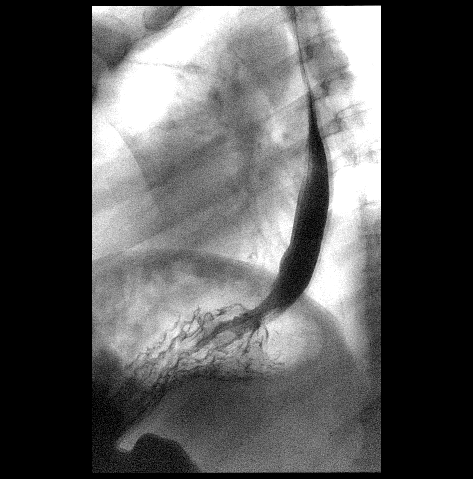
[frame 46/53]
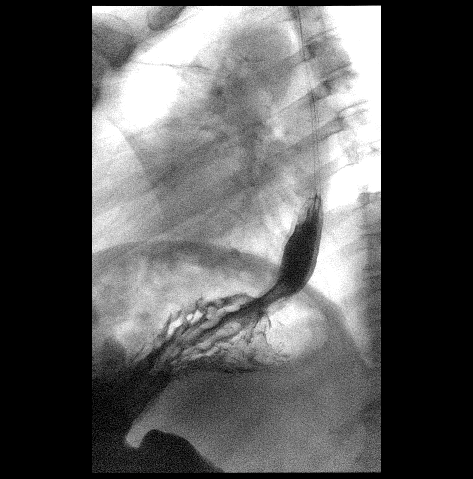

[Series 9: cp_standard · 0.51mm/px · 2 of 103 frames shown (6 of 10)]
[frame 52/103]
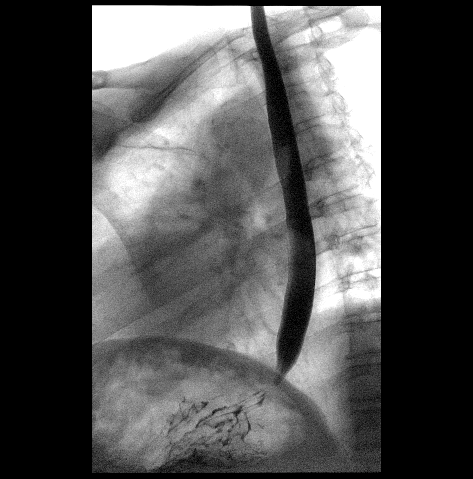
[frame 88/103]
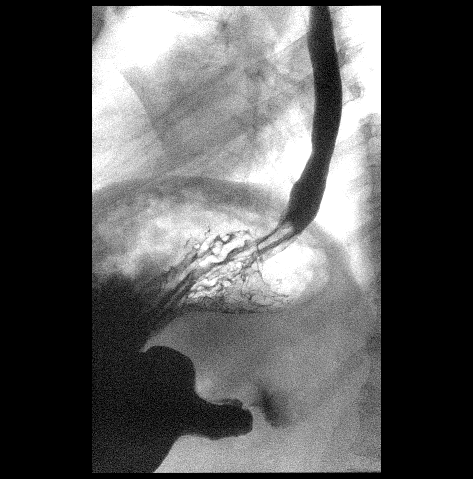

[Series 10: cp_standard · 0.51mm/px · 1 of 31 frames shown (7 of 10)]
[frame 26/31]
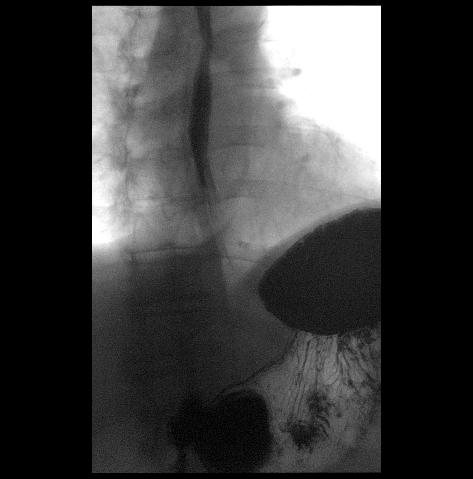

[Series 11: cp_standard · 0.25mm/px · 1 of 1 slices shown (8 of 10)]
[im 1/1]
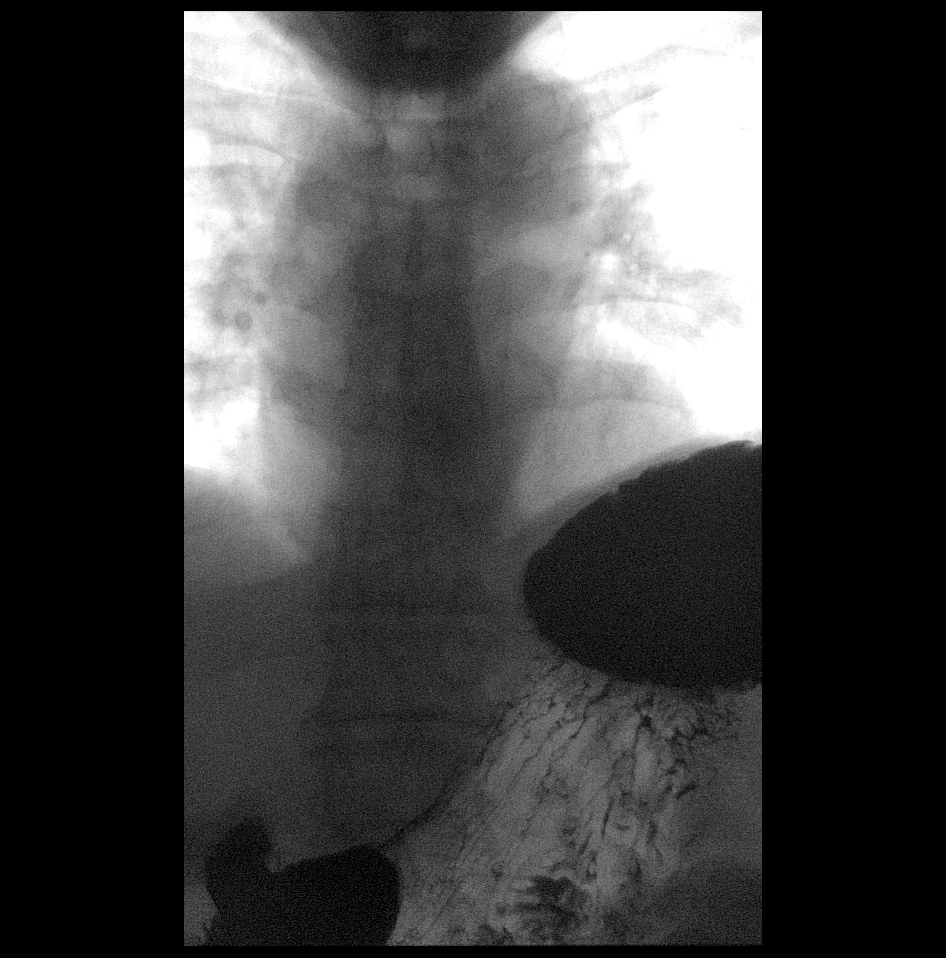

[Series 12: cp_standard · 0.51mm/px · 1 of 36 frames shown (9 of 10)]
[frame 30/36]
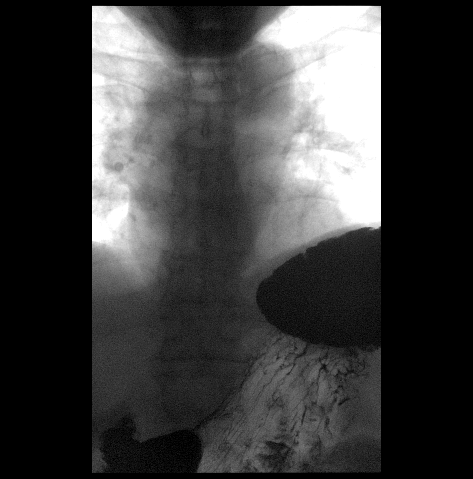

[Series 13: cp_standard · 0.51mm/px · 2 of 59 frames shown (10 of 10)]
[frame 9/59]
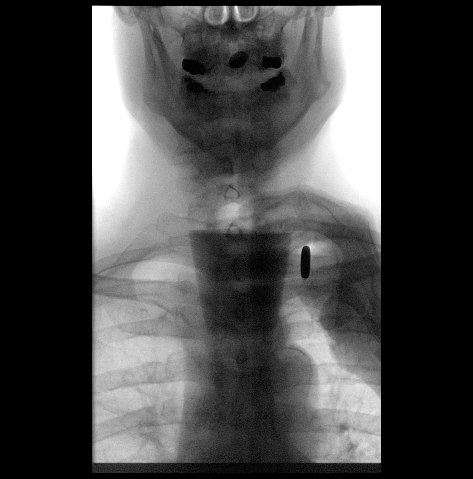
[frame 58/59]
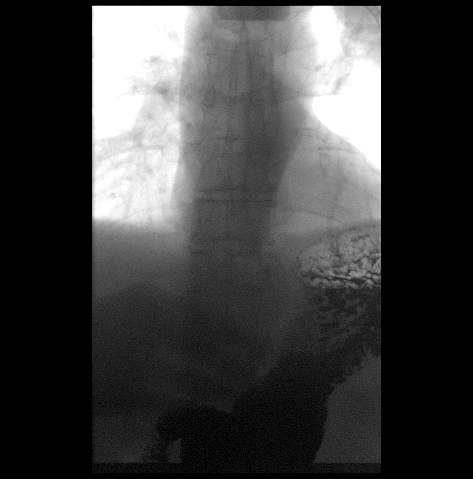

[16 of 24 positions shown; findings below may reference images not displayed]

FINDINGS: Swallowing: Appears normal. No vestibular penetration or aspiration
seen.

Pharynx: Unremarkable.

Esophagus: Normal appearance.

Esophageal motility: Occasional mild tertiary contractions.
Otherwise, within normal limits.

Hiatal Hernia: None.

Gastroesophageal reflux: None visualized.

Ingested 13mm barium tablet: Passed normally

Other: None.
IMPRESSION: 1. Rare mild tertiary contractions. Otherwise, normal esophagram, as
above.

## 2022-08-18 ENCOUNTER — Other Ambulatory Visit: Payer: Self-pay | Admitting: Cardiovascular Disease

## 2022-10-13 ENCOUNTER — Other Ambulatory Visit: Payer: Self-pay | Admitting: Gastroenterology

## 2022-10-13 DIAGNOSIS — I2510 Atherosclerotic heart disease of native coronary artery without angina pectoris: Secondary | ICD-10-CM

## 2022-11-01 NOTE — Progress Notes (Signed)
Cardiology Office Note:    Date:  11/15/2022   ID:  Gregory Fox, DOB 1955/08/12, MRN [id number]  PCP:  Ailene Ravel, MD  [facility] Providers Cardiologist:  Charlton Haws, MD    Referring MD: Ailene Ravel, MD   Chief Complaint:  No chief complaint on file.    Patient Profile: Coronary artery disease  Cath 8/19: mod LCx dz (prox 75), mild non-obstr dz in RCA, LAD >> Med Rx  ETT: 10/18: normal Myoview 8/16: no ischemia, low risk  Hypertension  Hyperlipidemia  Diabetes mellitus  GERD  Prior CV Studies: Echocardiogram 05/02/21 EF 60-65, no RWMA, mild asymmetric LVH, Gr 1 DD, normal RVSF, trivial MR, AV sclerosis w/o AS  Myoview 05/02/21  Normal stress nuclear study with no ischemia or infarction.  Gated ejection fraction 60% with normal wall motion.  Cardiac catheterization 03/07/18 LM 30 LAD ost 40, mid 40 LCx prox 75 RCA mid 40, dist 65 Med Rx  ETT 04/25/17 Neg GXT  Myoview 03/04/15 No ischemia, EF 50; low risk  ECHOCARDIOGRAM 02/10/15 EF 65-70, normal wall motion, GRII DD, mild LAE, normal RVSF    History of Present Illness:    67 y.o. with history of moderate CAD by cath 03/07/2018 involving 75% proximal LCX and 65% distal RCA  Cath done by Dr Excell Seltzer who felt anatomy stable from 2016 and medical Rx warranted. Had non ischemic myovue 05/02/21 Has ventral hernia and some GERD CRF;s HTN and HLD Murmur from AV sclerosis on TTE 05/02/21 60-65%   BS poorly controlled Issues getting Trulicity BS as high as 300 in am   Active at home with chickens. 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  cath in 02/2015 showing 75% Prox-mid Cx, otherwise nonobstructive disease. b. Cath 02/2018 stable from prior -> med rx. // Myoview 10/22: EF 60, no ischemia or infarction; low risk   Colon polyps    GERD (gastroesophageal reflux disease)    Gout    Heart  murmur    Echo 10/22: EF 60-65, no RWMA, mild asymmetric LVH, GR 1 DD, normal RVSF, trivial MR, mild AV calcification, mild AV sclerosis without stenosis   Hypercholesteremia    Hypertension    Kidney stones    Taste absent 01/24/2012   "had wisdom tooth pulled; couple days later 2 crowns put in; can't taste since"   Type II diabetes mellitus    Current Medications: Current Meds  Medication Sig   atorvastatin (LIPITOR) 40 MG tablet Take 40 mg by mouth at bedtime.   BD PEN NEEDLE MICRO U/F 32G X 6 MM MISC    carvedilol (COREG) 25 MG tablet Take 1 tablet (25 mg total) by mouth 2 (two) times daily. Please call to schedule follow up appointment.   clopidogrel (PLAVIX) 75 MG tablet TAKE 1 TABLET (75 MG TOTAL) BY MOUTH DAILY.   famotidine (PEPCID) 20 MG tablet TAKE 1 TABLET BY MOUTH EVERYDAY AT BEDTIME   KRILL OIL PO Take 1 tablet by mouth daily.   losartan-hydrochlorothiazide (HYZAAR) 50-12.5 MG tablet TAKE 1 TABLET BY MOUTH EVERY DAY   metFORMIN (GLUCOPHAGE-XR) 500 MG 24 hr tablet Take 1,500 mg by mouth every evening.    nitroGLYCERIN (NITROSTAT) 0.4 MG SL tablet Take 1 tablet under your tongue, while sitting for chest pain. If no relief of pain may take one tab  every 5 minutes up to 3 tabs total over 15 min. If no relief CALL 911.   ONETOUCH VERIO test strip USE TO CHECK BLOOD GLUCOSE SUGAR TWICE A DAY BEFORE MEALS 50   OZEMPIC, 0.25 OR 0.5 MG/DOSE, 2 MG/3ML SOPN Inject 0.25 mg into the skin once a week.   pantoprazole (PROTONIX) 40 MG tablet TAKE 1 TABLET BY MOUTH EVERY DAY   TRESIBA FLEXTOUCH 200 UNIT/ML SOPN Inject 50 Units as directed at bedtime.   TRULICITY 1.5 MG/0.5ML SOPN Inject 1.5 mg as directed every Sunday.     Allergies:   Sulfa antibiotics   Social History   Tobacco Use   Smoking status: Former    Packs/day: .25    Types: Cigarettes    Quit date: 2002    Years since quitting: 22.3   Smokeless tobacco: Current    Types: Snuff  Vaping Use   Vaping Use: Never used   Substance Use Topics   Alcohol use: Yes    Alcohol/week: 7.0 standard drinks of alcohol    Types: 7 Cans of beer per week    Comment: beer once every 3 weeks   Drug use: No    Family Hx: The patient's family history includes Autism in his son; Breast cancer in his mother; CAD in his father; Cervical cancer in his mother; Colon cancer in his maternal grandfather and sister; Colon cancer (age of onset: 81) in his father; Diabetes in his son; Heart disease in his mother; Hyperlipidemia in his son; Hypertension in his father, mother, and son; Ovarian cancer in his mother.  ROS see HPI  EKGs/Labs/Other Test Reviewed:    EKG:  11/15/2022 SR rate 67 RBBB no acute changes   Recent Labs: No results found for requested labs within last 365 days.   Recent Lipid Panel No results for input(s): "CHOL", "TRIG", "HDL", "VLDL", "LDLCALC", "LDLDIRECT" in the last 8760 hours.   Risk Assessment/Calculations:         Physical Exam:    VS:  BP (!) 120/90   Pulse 67   Ht 6' (1.829 m)   Wt 209 lb (94.8 kg)   SpO2 97%   BMI 28.35 kg/m     Wt Readings from Last 3 Encounters:  11/15/22 209 lb (94.8 kg)  08/15/21 221 lb 3.2 oz (100.3 kg)  07/26/21 221 lb (100.2 kg)    Affect appropriate Healthy:  appears stated age HEENT: normal Neck supple with no adenopathy JVP normal no bruits no thyromegaly Lungs clear with no wheezing and good diaphragmatic motion Heart:  S1/S2 SEM murmur, no rub, gallop or click PMI normal Abdomen: benighn, BS positve, no tenderness, no AAA no bruit.  No HSM or HJR Distal pulses intact with no bruits No edema Neuro non-focal Skin warm and dry No muscular weakness        ASSESSMENT & PLAN:   No problem-specific Assessment & Plan notes found for this encounter.      CAD:  moderate by cath 2019 with non ischemic myovue 2022 Continue plavix ( GERD worse with ASA ) and statin and coreg RBBB:  chronic yearly ECG HTN:  stable continue current meds including Losartan  HCTZ 4.   HLD on statin needs updated labs 5.   Murmur:  no change AV sclerosis by TTE  05/02/21  6.   DM:  needs to be referred to endocrine If he cannot get Trulicity will need more insulin Starting Ozempic 7.  GERD: followed by GI Dr Arta Silence EGD 06/23/21  with Schatzki ring and gastric polyps with colon showing diverticulosis Continue PPI     Dispo:  F/U in a year    Medication Adjustments/Labs and Tests Ordered: Current medicines are reviewed at length with the patient today.  Concerns regarding medicines are outlined above.  Tests Ordered: No orders of the defined types were placed in this encounter.  Medication Changes: No orders of the defined types were placed in this encounter.  Signed, Charlton HawsPeter Maria Gallicchio, MD  11/15/2022 8:26 AM    [facility] [address], [place], [place]  [address] Phone: [phone]; Fax: [phone]

## 2022-11-15 ENCOUNTER — Encounter: Payer: Self-pay | Admitting: Cardiovascular Disease

## 2022-11-15 ENCOUNTER — Ambulatory Visit: Payer: BC Managed Care – PPO | Attending: Cardiovascular Disease | Admitting: Cardiovascular Disease

## 2022-11-15 VITALS — BP 120/90 | HR 67 | Ht 72.0 in | Wt 209.0 lb

## 2022-11-15 DIAGNOSIS — I2510 Atherosclerotic heart disease of native coronary artery without angina pectoris: Secondary | ICD-10-CM

## 2022-11-15 DIAGNOSIS — E7800 Pure hypercholesterolemia, unspecified: Secondary | ICD-10-CM | POA: Diagnosis not present

## 2022-11-15 DIAGNOSIS — I4510 Unspecified right bundle-branch block: Secondary | ICD-10-CM

## 2022-11-15 DIAGNOSIS — I10 Essential (primary) hypertension: Secondary | ICD-10-CM

## 2022-11-15 NOTE — Patient Instructions (Signed)
Medication Instructions:  Your physician recommends that you continue on your current medications as directed. Please refer to the Current Medication list given to you today.  *If you need a refill on your cardiac medications before your next appointment, please call your pharmacy*  Lab Work: If you have labs (blood work) drawn today and your tests are completely normal, you will receive your results only by: MyChart Message (if you have MyChart) OR A paper copy in the mail If you have any lab test that is abnormal or we need to change your treatment, we will call you to review the results.  Testing/Procedures: None ordered today.  Follow-Up: At [facility], you and your health needs are our priority.  As part of our continuing mission to provide you with exceptional heart care, we have created designated Provider Care Teams.  These Care Teams include your primary Cardiologist (physician) and Advanced Practice Providers (APPs -  Physician Assistants and Nurse Practitioners) who all work together to provide you with the care you need, when you need it.  We recommend signing up for the patient portal called "MyChart".  Sign up information is provided on this After Visit Summary.  MyChart is used to connect with patients for Virtual Visits (Telemedicine).  Patients are able to view lab/test results, encounter notes, upcoming appointments, etc.  Non-urgent messages can be sent to your provider as well.   To learn more about what you can do with MyChart, go to [email or web address].    Your next appointment:   1 year(s)  Provider:   Peter Nishan, MD      

## 2023-04-04 ENCOUNTER — Other Ambulatory Visit: Payer: Self-pay | Admitting: Gastroenterology

## 2023-04-04 DIAGNOSIS — I2510 Atherosclerotic heart disease of native coronary artery without angina pectoris: Secondary | ICD-10-CM

## 2023-10-10 ENCOUNTER — Encounter: Payer: Self-pay | Admitting: Gastroenterology

## 2023-10-10 ENCOUNTER — Ambulatory Visit: Payer: BC Managed Care – PPO | Admitting: Gastroenterology

## 2023-10-10 VITALS — BP 118/60 | HR 84 | Ht 70.0 in | Wt 211.2 lb

## 2023-10-10 DIAGNOSIS — K219 Gastro-esophageal reflux disease without esophagitis: Secondary | ICD-10-CM

## 2023-10-10 DIAGNOSIS — R1319 Other dysphagia: Secondary | ICD-10-CM

## 2023-10-10 DIAGNOSIS — K429 Umbilical hernia without obstruction or gangrene: Secondary | ICD-10-CM

## 2023-10-10 DIAGNOSIS — R1110 Vomiting, unspecified: Secondary | ICD-10-CM

## 2023-10-10 DIAGNOSIS — R1033 Periumbilical pain: Secondary | ICD-10-CM

## 2023-10-10 MED ORDER — FAMOTIDINE 20 MG PO TABS: 20.0000 mg | ORAL_TABLET | Freq: Every day | ORAL | 1 refills | Status: DC

## 2023-10-10 NOTE — Patient Instructions (Addendum)
 VISIT SUMMARY:  Today, we discussed your ongoing issues with regurgitation and abdominal discomfort. We reviewed your history of hiatal hernia and Schatzki's ring, and you shared your concerns about worsening symptoms. We also addressed your umbilical hernia and changes in bowel habits since starting Ozempic. A plan was made to further evaluate and manage these conditions.  YOUR PLAN:  -GASTROESOPHAGEAL REFLUX DISEASE (GERD) WITH HIATAL HERNIA: GERD is a condition where stomach acid frequently flows back into the tube connecting your mouth and stomach, causing discomfort. Your symptoms suggest that your hiatal hernia might be getting larger. We will schedule an upper endoscopy on October 21, 2023, to examine your esophagus and stomach. Depending on the findings, we may consider additional tests like a CT scan or a swallow test. Continue taking pantoprazole, eat small frequent meals, avoid eating 4 hours before bedtime, and avoid heavy lifting.  -UMBILICAL HERNIA: An umbilical hernia occurs when part of the intestine protrudes through an opening in the abdominal muscles. Your hernia is causing discomfort and sensitivity. We will order a CT scan to evaluate the hernia and check for any nodules. Avoid lifting more than 30 pounds and activities that increase abdominal pressure. You may consider wearing a binder for support, though it may be uncomfortable.  -ALTERED BOWEL HABITS: Your changes in bowel habits may be related to your use of Ozempic. You are experiencing increased frequency of bowel movements in the morning. To improve bowel regularity, increase your dietary fiber intake through fruits, vegetables, and oatmeal. If dietary changes are not enough, consider fiber supplements like Benefiber or psyllium husk. Ensure you drink at least 8 cups of water daily.  INSTRUCTIONS:  Please follow up with the scheduled upper endoscopy on October 21, 2023. We will also arrange a CT scan to evaluate your umbilical  hernia. Continue with the current medications and dietary recommendations. Avoid heavy lifting and activities that increase abdominal pressure.  You will be contacted by [facility] Scheduling in the next 2 days to arrange a CT Abdomen/Pelvis.  The number on your caller ID will be [phone], please answer when they call.  If you have not heard from them in 2 days please call [phone] to schedule.    You have been scheduled for an endoscopy. Please follow written instructions given to you at your visit today.  If you use inhalers (even only as needed), please bring them with you on the day of your procedure.  If you take any of the following medications, they will need to be adjusted prior to your procedure:   DO NOT TAKE 7 DAYS PRIOR TO TEST- Trulicity (dulaglutide) Ozempic, Wegovy (semaglutide) Mounjaro (tirzepatide) Bydureon Bcise (exanatide extended release)  DO NOT TAKE 1 DAY PRIOR TO YOUR TEST Rybelsus (semaglutide) Adlyxin (lixisenatide) Victoza (liraglutide) Byetta (exanatide) ___________________________________________________________________________    I appreciate the  opportunity to care for you  Thank You   Marsa Aris , MD  _X  _   ORAL DIABETIC MEDICATION INSTRUCTIONS  The day before your procedure:  Take your diabetic pill as you do normally  The day of your procedure:  Do not take your diabetic pill   We will check your blood sugar levels during the admission process and again in Recovery before discharging you home  ______________________________________________________________________  _ X _   INSULIN (LONG ACTING) MEDICATION INSTRUCTIONS (Lantus, NPH, 70/30, Humulin, Novolin-N, Levemir, Toujeo, Johann Capers )   The day before your procedure:  Take  your regular evening dose    The  day of your procedure:  Do not take your morning dose   _  _   INSULIN (SHORT ACTING) MEDICATION INSTRUCTIONS (Regular, Humulog,  Novolog, Apidra, Novolin, Humulin)   The day before your procedure:  Do not take your evening dose   The day of your procedure:  Do not take your morning dose  ______________________________________________________________________                _ _ OTHER NON-INSULIN GLPI AGONIST INJECTABLE OR ORAL DAILY MEDICATIONS         (Tanzeum, Saxenda Byetta, Victoza, SymlinPen, Rebelsus)  Hold the day of the procedure ______________________________________________________________________________________________________________________________________________________     __ Juliann Pares __ GLP 1 AGONIST Weekly injectables                   (Bydureon Bcise, [place], [place], [place], [place], [place])    Hold for 7 days prior to the procedure

## 2023-10-10 NOTE — Progress Notes (Signed)
 Gregory Fox    [id number]    09-04-1955  Primary Care Physician:[name], Durward Fortes, MD  Referring Physician: Ailene Ravel, MD [address] [place],  [place] [address]   Chief complaint:  GERD, dysphagia  Discussed the use of AI scribe software for clinical note transcription with the patient, who gave verbal consent to proceed.  History of Present Illness   Gregory Fox "Gregory Fox" is a 68 year old male with hiatal hernia and Schatzki's ring who presents with worsening regurgitation and abdominal discomfort. He is accompanied by his caregiver.  He experiences worsening regurgitation, particularly when lying flat, causing discomfort as it comes back into his throat. Despite trying to manage this by eating earlier in the evening, he notes no significant improvement. He is concerned about the potential enlargement of his hiatal hernia contributing to these symptoms. He recalls a previous endoscopy identifying a Schatzki's ring in the lower esophagus, which he worries might be tightening and causing further issues. He is currently taking pantoprazole.  He experiences constant abdominal discomfort, particularly in the area of a small hernia, which is sensitive to touch and causes pain when pressure is applied. He describes the sensation as 'twenty four seven' discomfort and notes that it feels like his insides are 'spilling out' when bending over. He has not had any prior surgeries and is mindful of not lifting heavy weights, as he has two sons who assist with such tasks when they are available.  He describes changes in his bowel habits since starting Ozempic, noting that while he experiences less diarrhea, he still needs to have multiple bowel movements in the morning to feel complete evacuation. He does not skip days and primarily goes in the mornings. He does not currently take fiber supplements but consumes some fruits and vegetables.       Colonoscopy June 23, 2021 -  Diverticulosis in the sigmoid colon and in the descending colon. - Non-bleeding external and internal hemorrhoids. - No specimens collected.   EGD June 23, 2021 - A non-obstructing Schatzki ring was found at the gastroesophageal junction. - The Z-line was regular and was found 40 cm from the incisors. - The exam of the esophagus was otherwise normal. - Multiple large pedunculated and sessile fundic gland polyps with no bleeding and no stigmata of recent bleeding were found in the gastric fundus and in the gastric body. Biopsies were taken with a cold forceps for histology showed benign fundic gland polyps, negative for H. pylori bacterial infection - The cardia and gastric fundus were normal on retroflexion. - The examined duodenum was normal.    Outpatient Encounter Medications as of 10/10/2023  Medication Sig   atorvastatin (LIPITOR) 40 MG tablet Take 40 mg by mouth at bedtime.   BD PEN NEEDLE MICRO U/F 32G X 6 MM MISC    carvedilol (COREG) 25 MG tablet Take 1 tablet (25 mg total) by mouth 2 (two) times daily. Please call to schedule follow up appointment.   fluticasone (FLONASE) 50 MCG/ACT nasal spray Place 1 spray into both nostrils 2 (two) times daily.   KRILL OIL PO Take 1 tablet by mouth daily.   losartan-hydrochlorothiazide (HYZAAR) 50-12.5 MG tablet TAKE 1 TABLET BY MOUTH EVERY DAY   metFORMIN (GLUCOPHAGE-XR) 500 MG 24 hr tablet Take 1,500 mg by mouth every evening.    nitroGLYCERIN (NITROSTAT) 0.4 MG SL tablet Take 1 tablet under your tongue, while sitting for chest pain. If  no relief of pain may take one tab every 5 minutes up to 3 tabs total over 15 min. If no relief CALL 911.   ONETOUCH VERIO test strip USE TO CHECK BLOOD GLUCOSE SUGAR TWICE A DAY BEFORE MEALS 50   OZEMPIC, 2 MG/DOSE, 8 MG/3ML SOPN Inject 2 mg into the skin once a week.   pantoprazole (PROTONIX) 40 MG tablet TAKE 1 TABLET BY MOUTH EVERY DAY   TRESIBA FLEXTOUCH 100 UNIT/ML FlexTouch Pen Inject 75 Units into  the skin daily.   [DISCONTINUED] famotidine (PEPCID) 20 MG tablet TAKE 1 TABLET BY MOUTH EVERYDAY AT BEDTIME   famotidine (PEPCID) 20 MG tablet Take 1 tablet (20 mg total) by mouth at bedtime.   [DISCONTINUED] clopidogrel (PLAVIX) 75 MG tablet TAKE 1 TABLET (75 MG TOTAL) BY MOUTH DAILY.   [DISCONTINUED] Olopatadine HCl 0.2 % SOLN Apply 1 drop to eye 2 (two) times daily. (Patient not taking: Reported on 11/15/2022)   [DISCONTINUED] OZEMPIC, 0.25 OR 0.5 MG/DOSE, 2 MG/3ML SOPN Inject 0.25 mg into the skin once a week.   [DISCONTINUED] TRESIBA FLEXTOUCH 200 UNIT/ML SOPN Inject 50 Units as directed at bedtime.   [DISCONTINUED] TRULICITY 1.5 MG/0.5ML SOPN Inject 1.5 mg as directed every Sunday.    No facility-administered encounter medications on file as of 10/10/2023.    Allergies as of 10/10/2023 - Review Complete 10/10/2023  Allergen Reaction Noted   Sulfa antibiotics Rash 01/24/2012    Past Medical History:  Diagnosis Date   Anal fissure    CAD (coronary artery disease)    a.  cath in 02/2015 showing 75% Prox-mid Cx, otherwise nonobstructive disease. b. Cath 02/2018 stable from prior -> med rx. // Myoview 10/22: EF 60, no ischemia or infarction; low risk   Colon polyps    GERD (gastroesophageal reflux disease)    Gout    Heart murmur    Echo 10/22: EF 60-65, no RWMA, mild asymmetric LVH, GR 1 DD, normal RVSF, trivial MR, mild AV calcification, mild AV sclerosis without stenosis   Hypercholesteremia    Hypertension    Kidney stones    Taste absent 01/24/2012   "had wisdom tooth pulled; couple days later 2 crowns put in; can't taste since"   Type II diabetes mellitus (HCC)     Past Surgical History:  Procedure Laterality Date   CARDIAC CATHETERIZATION N/A 02/25/2015   Procedure: Left Heart Cath and Coronary Angiography;  Surgeon: Lyn Records, MD;  Location: [facility] INVASIVE CV LAB;  Service: Cardiovascular;  Laterality: N/A;   LEFT HEART CATH AND CORONARY ANGIOGRAPHY N/A 03/07/2018    Procedure: LEFT HEART CATH AND CORONARY ANGIOGRAPHY;  Surgeon: Tonny Bollman, MD;  Location: [facility] INVASIVE CV LAB;  Service: Cardiovascular;  Laterality: N/A;   NO PAST SURGERIES      Family History  Problem Relation Age of Onset   Breast cancer Mother    Ovarian cancer Mother    Cervical cancer Mother    Heart disease Mother    Hypertension Mother    CAD Father    Colon cancer Father 19   Hypertension Father    Colon cancer Sister        colostomy bag   Colon cancer Maternal Grandfather    Diabetes Son    Autism Son    Hyperlipidemia Son    Hypertension Son     Social History   Socioeconomic History   Marital status: Married    Spouse name: Not on file   Number of children: 2  Years of education: Not on file   Highest education level: Not on file  Occupational History   Occupation: retired  Tobacco Use   Smoking status: Former    Current packs/day: 0.00    Types: Cigarettes    Quit date: 2002    Years since quitting: 23.2   Smokeless tobacco: Current    Types: Snuff  Vaping Use   Vaping status: Never Used  Substance and Sexual Activity   Alcohol use: Yes    Alcohol/week: 7.0 standard drinks of alcohol    Types: 7 Cans of beer per week    Comment: beer once every 3 weeks   Drug use: No   Sexual activity: Not on file  Other Topics Concern   Not on file  Social History Narrative   Not on file   Social Drivers of Health   Financial Resource Strain: Not on file  Food Insecurity: Not on file  Transportation Needs: Not on file  Physical Activity: Not on file  Stress: Not on file  Social Connections: Not on file  Intimate Partner Violence: Not on file      Review of systems: All other review of systems negative except as mentioned in the HPI.   Physical Exam: Vitals:   10/10/23 0818  BP: 118/60  Pulse: 84   Body mass index is 30.31 kg/m. Gen:      No acute distress HEENT:  sclera anicteric CV: s1s2 rrr, no murmur Lungs: B/l clear. Abd:       soft, non-tender; no palpable masses, no distension Ext:    No edema Neuro: alert and oriented x 3 Psych: normal mood and affect  Data Reviewed:  Reviewed labs, radiology imaging, old records and pertinent past GI work up     Assessment and Plan    Gastroesophageal reflux disease (GERD) with hiatal hernia Chronic GERD symptoms with worsening regurgitation and inability to sleep flat, suggestive of an enlarging hiatal hernia. Schatzki's ring in the lower esophagus may contribute to symptoms. Surgical intervention considered if hernia significantly affects daily life, with risks including potential worsening if esophageal motility is weak, necessitating preoperative evaluation of swallowing function. - Schedule upper endoscopy on October 21, 2023, to evaluate the esophagus and stomach. - Consider additional CT chest or swallow test based on endoscopy findings. - Continue pantoprazole. - Advise small frequent meals and avoid meals 4 hours before bedtime. - Avoid heavy lifting.  Umbilical hernia Small reducible umbilical hernia causing discomfort and sensitivity, with risk of bowel obstruction or strangulation if it becomes irreducible. CT scan planned to evaluate the hernia and check for any nodules. Advised to avoid activities that increase intra-abdominal pressure. - Order CT scan to evaluate the umbilical hernia and check for any nodules. - Advise against lifting more than 30 pounds. - Instruct to avoid activities that increase intra-abdominal pressure. - Consider wearing a binder for support, though he has difficulty tolerating it.  Altered bowel habits Changes in bowel habits potentially related to Ozempic use, with increased frequency of bowel movements in the morning. No diarrhea reported, but requires multiple trips to evacuate completely. Advised to increase dietary fiber and hydration to improve bowel regularity. - Recommend increasing dietary fiber through natural sources such  as fruits, vegetables, and oatmeal. - Consider fiber supplements like Benefiber or psyllium husk if dietary changes are insufficient. - Ensure adequate hydration with at least 8 cups of water daily.  Recording duration: 14 minutes      The patient was provided an  opportunity to ask questions and all were answered. The patient agreed with the plan and demonstrated an understanding of the instructions.  Iona Beard , MD    CC: [name], Durward Fortes, MD

## 2023-10-16 ENCOUNTER — Encounter: Payer: Self-pay | Admitting: Gastroenterology

## 2023-10-17 ENCOUNTER — Ambulatory Visit (HOSPITAL_COMMUNITY)
Admission: RE | Admit: 2023-10-17 | Discharge: 2023-10-17 | Disposition: A | Discharge status: Home / Self Care | Source: Ambulatory Visit | Attending: Gastroenterology | Admitting: Gastroenterology

## 2023-10-17 DIAGNOSIS — R1033 Periumbilical pain: Secondary | ICD-10-CM | POA: Insufficient documentation

## 2023-10-17 DIAGNOSIS — K429 Umbilical hernia without obstruction or gangrene: Secondary | ICD-10-CM | POA: Diagnosis present

## 2023-10-17 LAB — POCT I-STAT CREATININE: Creatinine, Ser: 0.8 mg/dL (ref 0.61–1.24)

## 2023-10-17 MED ORDER — IOHEXOL 300 MG/ML  SOLN
100.0000 mL | Freq: Once | INTRAMUSCULAR | Status: AC | PRN
Start: 2023-10-17 — End: 2023-10-17
  Administered 2023-10-17: 100 mL via INTRAVENOUS

## 2023-10-21 ENCOUNTER — Ambulatory Visit (AMBULATORY_SURGERY_CENTER): Admitting: Gastroenterology

## 2023-10-21 ENCOUNTER — Encounter: Payer: Self-pay | Admitting: Gastroenterology

## 2023-10-21 VITALS — BP 109/55 | HR 68 | Temp 98.3°F | Resp 17 | Ht 70.0 in | Wt 211.4 lb

## 2023-10-21 DIAGNOSIS — K317 Polyp of stomach and duodenum: Secondary | ICD-10-CM

## 2023-10-21 DIAGNOSIS — K2950 Unspecified chronic gastritis without bleeding: Secondary | ICD-10-CM

## 2023-10-21 DIAGNOSIS — K219 Gastro-esophageal reflux disease without esophagitis: Secondary | ICD-10-CM

## 2023-10-21 DIAGNOSIS — R1319 Other dysphagia: Secondary | ICD-10-CM

## 2023-10-21 MED ORDER — SODIUM CHLORIDE 0.9 % IV SOLN: 500.0000 mL | Freq: Once | INTRAVENOUS | Status: DC

## 2023-10-21 NOTE — Progress Notes (Signed)
 Please refer to office visit note 10/10/23. No additional changes in H&P Patient is appropriate for planned procedure(s) and anesthesia in an ambulatory setting  K. Scherry Ran , MD [phone]

## 2023-10-21 NOTE — Progress Notes (Signed)
 Called to room to assist during endoscopic procedure.  Patient ID and intended procedure confirmed with present staff. Received instructions for my participation in the procedure from the performing physician.

## 2023-10-21 NOTE — Op Note (Signed)
 [facility] Patient Name: Gregory Fox Procedure Date: 10/21/2023 7:49 AM MRN: [id number] Endoscopist: Napoleon Form , MD, [id number] Age: 68 Referring MD:  Date of Birth: March 28, 1956 Gender: Male Account #: [id number] Procedure:                Upper GI endoscopy Indications:              Esophageal reflux symptoms that persist despite                            appropriate therapy Medicines:                Monitored Anesthesia Care Procedure:                Pre-Anesthesia Assessment:                           - Prior to the procedure, a History and Physical                            was performed, and patient medications and                            allergies were reviewed. The patient's tolerance of                            previous anesthesia was also reviewed. The risks                            and benefits of the procedure and the sedation                            options and risks were discussed with the patient.                            All questions were answered, and informed consent                            was obtained. Prior Anticoagulants: The patient has                            taken no anticoagulant or antiplatelet agents. ASA                            Grade Assessment: II - A patient with mild systemic                            disease. After reviewing the risks and benefits,                            the patient was deemed in satisfactory condition to                            undergo the procedure.  After obtaining informed consent, the endoscope was                            passed under direct vision. Throughout the                            procedure, the patient's blood pressure, pulse, and                            oxygen saturations were monitored continuously. The                            Olympus Scope [id number] was introduced through the                            mouth, and advanced to the  second part of duodenum.                            The upper GI endoscopy was accomplished without                            difficulty. The patient tolerated the procedure                            well. Scope In: Scope Out: Findings:                 The Z-line was regular and was found 40 cm from the                            incisors.                           No gross lesions were noted in the entire esophagus.                           Patchy mild inflammation characterized by                            congestion (edema), erythema, friability and                            nodularity was found in the entire examined                            stomach. Biopsies were taken with a cold forceps                            for Helicobacter pylori testing.                           A single 11 mm sessile polyp with no bleeding and                            no stigmata of recent bleeding was  found in the                            cardia. Biopsies were taken with a cold forceps for                            histology.                           Multiple large pedunculated and sessile polyps with                            no bleeding and no stigmata of recent bleeding were                            found in the gastric body. Biopsies were taken with                            a cold forceps for histology.                           The cardia and gastric fundus were normal on                            retroflexion.                           The examined duodenum was normal. Complications:            No immediate complications. Estimated Blood Loss:     Estimated blood loss was minimal. Impression:               - Z-line regular, 40 cm from the incisors.                           - No gross lesions in the entire esophagus.                           - Gastritis. Biopsied.                           - A single gastric polyp. Biopsied.                           - Multiple gastric  polyps. Biopsied.                           - Normal examined duodenum. Recommendation:           - Resume previous diet.                           - Continue present medications.                           - Await pathology results.                           -  Follow an antireflux regimen.                           - Repeat EGD based on path for surveillance Napoleon Form, MD 10/21/2023 8:32:41 AM This report has been signed electronically.

## 2023-10-21 NOTE — Patient Instructions (Addendum)
 Resume previous diet and medications.  Repeat EGD and medication changes based on biopsy and CT results - Dr. Lavon Paganini will contact you and let you know next steps.  Education attached on stomach polyps and hiatal hernia.    YOU HAD AN ENDOSCOPIC PROCEDURE TODAY AT THE [facility]:   Refer to the procedure report that was given to you for any specific questions about what was found during the examination.  If the procedure report does not answer your questions, please call your gastroenterologist to clarify.  If you requested that your care partner not be given the details of your procedure findings, then the procedure report has been included in a sealed envelope for you to review at your convenience later.  YOU SHOULD EXPECT: Some feelings of bloating in the abdomen. Passage of more gas than usual.  Walking can help get rid of the air that was put into your GI tract during the procedure and reduce the bloating. If you had a lower endoscopy (such as a colonoscopy or flexible sigmoidoscopy) you may notice spotting of blood in your stool or on the toilet paper. If you underwent a bowel prep for your procedure, you may not have a normal bowel movement for a few days.  Please Note:  You might notice some irritation and congestion in your nose or some drainage.  This is from the oxygen used during your procedure.  There is no need for concern and it should clear up in a day or so.  SYMPTOMS TO REPORT IMMEDIATELY:  Following upper endoscopy (EGD)  Vomiting of blood or coffee ground material  New chest pain or pain under the shoulder blades  Painful or persistently difficult swallowing  New shortness of breath  Fever of 100F or higher  Black, tarry-looking stools  For urgent or emergent issues, a gastroenterologist can be reached at any hour by calling [phone]. Do not use MyChart messaging for urgent concerns.    DIET:  We do recommend a small meal at first, but then you may  proceed to your regular diet.  Drink plenty of fluids but you should avoid alcoholic beverages for 24 hours.  ACTIVITY:  You should plan to take it easy for the rest of today and you should NOT DRIVE or use heavy machinery until tomorrow (because of the sedation medicines used during the test).    FOLLOW UP: Our staff will call the number listed on your records the next business day following your procedure.  We will call around 7:15- 8:00 am to check on you and address any questions or concerns that you may have regarding the information given to you following your procedure. If we do not reach you, we will leave a message.     If any biopsies were taken you will be contacted by phone or by letter within the next 1-3 weeks.  Please call us at [phone] if you have not heard about the biopsies in 3 weeks.    SIGNATURES/CONFIDENTIALITY: You and/or your care partner have signed paperwork which will be entered into your electronic medical record.  These signatures attest to the fact that that the information above on your After Visit Summary has been reviewed and is understood.  Full responsibility of the confidentiality of this discharge information lies with you and/or your care-partner.

## 2023-10-21 NOTE — Progress Notes (Signed)
 Report to PACU, RN, vss, BBS= Clear.

## 2023-10-21 NOTE — Progress Notes (Signed)
 Pt's states no medical or surgical changes since previsit or office visit.

## 2023-10-22 ENCOUNTER — Telehealth: Payer: Self-pay | Admitting: *Deleted

## 2023-10-22 NOTE — Telephone Encounter (Signed)
 Attempted post procedure follow up call.  No answer - LVM.

## 2023-10-23 LAB — SURGICAL PATHOLOGY

## 2023-11-05 NOTE — Progress Notes (Unsigned)
 Cardiology Office Note:    Date:  11/13/2023   ID:  Gregory Fox, DOB 12-Sep-1955, MRN [id number]  PCP:  Annette Barters, MD  [facility] Providers Cardiologist:  Janelle Mediate, MD    Referring MD: Annette Barters, MD   Chief Complaint:  No chief complaint on file.    Patient Profile: Coronary artery disease  Cath 8/19: mod LCx dz (prox 75), mild non-obstr dz in RCA, LAD >> Med Rx  ETT: 10/18: normal Myoview  8/16: no ischemia, low risk  Hypertension  Hyperlipidemia  Diabetes mellitus  GERD  Prior CV Studies: Echocardiogram 05/02/21 EF 60-65, no RWMA, mild asymmetric LVH, Gr 1 DD, normal RVSF, trivial MR, AV sclerosis w/o AS  Myoview  05/02/21  Normal stress nuclear study with no ischemia or infarction.  Gated ejection fraction 60% with normal wall motion.  Cardiac catheterization 03/07/18 LM 30 LAD ost 40, mid 40 LCx prox 75 RCA mid 40, dist 65 Med Rx  ETT 04/25/17 Neg GXT  Myoview  03/04/15 No ischemia, EF 50; low risk  ECHOCARDIOGRAM 02/10/15 EF 65-70, normal wall motion, GRII DD, mild LAE, normal RVSF    History of Present Illness:    68 y.o. with history of moderate CAD by cath 03/07/2018 involving 75% proximal LCX and 65% distal RCA  Cath done by Dr Arlester Ladd who felt anatomy stable from 2016 and medical Rx warranted. Had non ischemic myovue 05/02/21 Has ventral hernia and some GERD CRF;s HTN and HLD Murmur from AV sclerosis on TTE 05/02/21 60-65%   BS poorly controlled Issues getting Trulicity BS as high as 300 in am   Significant GERD with recent EGD Dr Leonia Raman on PPI  Active at home with chickens. Both sons at home one teaches at [employer] with his mom the other is autistic and has trouble holding job  He has had some atypical chest pain resting radiating to left shoulder Some fatigue and exertional dyspnea as well  Past Medical History:  Diagnosis Date  . Anal fissure   . CAD (coronary artery disease)    a.  cath in 02/2015 showing 75% Prox-mid  Cx, otherwise nonobstructive disease. b. Cath 02/2018 stable from prior -> med rx. // Myoview  10/22: EF 60, no ischemia or infarction; low risk  . Colon polyps   . GERD (gastroesophageal reflux disease)   . Gout   . Heart murmur    Echo 10/22: EF 60-65, no RWMA, mild asymmetric LVH, GR 1 DD, normal RVSF, trivial MR, mild AV calcification, mild AV sclerosis without stenosis  . Hypercholesteremia   . Hypertension   . Kidney stones   . Taste absent 01/24/2012   "had wisdom tooth pulled; couple days later 2 crowns put in; can't taste since"  . Type II diabetes mellitus (HCC)    Current Medications: Current Meds  Medication Sig  . atorvastatin (LIPITOR) 40 MG tablet Take 40 mg by mouth at bedtime.  . BD PEN NEEDLE MICRO U/F 32G X 6 MM MISC   . carvedilol  (COREG ) 25 MG tablet Take 1 tablet (25 mg total) by mouth 2 (two) times daily. Please call to schedule follow up appointment.  . famotidine  (PEPCID ) 20 MG tablet Take 1 tablet (20 mg total) by mouth at bedtime.  . fluticasone  (FLONASE ) 50 MCG/ACT nasal spray Place 1 spray into both nostrils 2 (two) times daily.  Aaron Aas KRILL OIL PO Take 1 tablet by mouth daily.  . losartan -hydrochlorothiazide  (HYZAAR) 50-12.5 MG tablet TAKE 1 TABLET BY MOUTH EVERY DAY  .  metFORMIN  (GLUCOPHAGE -XR) 500 MG 24 hr tablet Take 1,500 mg by mouth every evening.   . nitroGLYCERIN  (NITROSTAT ) 0.4 MG SL tablet Take 1 tablet under your tongue, while sitting for chest pain. If no relief of pain may take one tab every 5 minutes up to 3 tabs total over 15 min. If no relief CALL 911.  Emmett Harman VERIO test strip USE TO CHECK BLOOD GLUCOSE SUGAR TWICE A DAY BEFORE MEALS 50  . OZEMPIC, 2 MG/DOSE, 8 MG/3ML SOPN Inject 2 mg into the skin once a week.  . pantoprazole  (PROTONIX ) 40 MG tablet TAKE 1 TABLET BY MOUTH EVERY DAY  . TRESIBA FLEXTOUCH 100 UNIT/ML FlexTouch Pen Inject 75 Units into the skin daily.    Allergies:   Sulfa antibiotics   Social History   Tobacco Use  . Smoking  status: Former    Current packs/day: 0.00    Types: Cigarettes    Quit date: 2002    Years since quitting: 23.3  . Smokeless tobacco: Current    Types: Snuff  Vaping Use  . Vaping status: Never Used  Substance Use Topics  . Alcohol use: Yes    Alcohol/week: 7.0 standard drinks of alcohol    Types: 7 Cans of beer per week    Comment: beer once every 3 weeks  . Drug use: No    Family Hx: The patient's family history includes Autism in his son; Breast cancer in his mother; CAD in his father; Cervical cancer in his mother; Colon cancer in his maternal grandfather and sister; Colon cancer (age of onset: 77) in his father; Diabetes in his son; Heart disease in his mother; Hyperlipidemia in his son; Hypertension in his father, mother, and son; Ovarian cancer in his mother.  ROS see HPI  EKGs/Labs/Other Test Reviewed:    EKG:  11/13/2023 SR rate 67 RBBB no acute changes   Recent Labs: 10/17/2023: Creatinine, Ser 0.80   Recent Lipid Panel No results for input(s): "CHOL", "TRIG", "HDL", "VLDL", "LDLCALC", "LDLDIRECT" in the last 8760 hours.   Risk Assessment/Calculations:         Physical Exam:    VS:  BP 126/74   Pulse 76   Ht 5\' 10"  (1.778 m)   Wt 209 lb 9.6 oz (95.1 kg)   SpO2 100%   BMI 30.07 kg/m     Wt Readings from Last 3 Encounters:  11/13/23 209 lb 9.6 oz (95.1 kg)  10/21/23 211 lb 6.4 oz (95.9 kg)  10/10/23 211 lb 4 oz (95.8 kg)    Affect appropriate Healthy:  appears stated age HEENT: normal Neck supple with no adenopathy JVP normal no bruits no thyromegaly Lungs clear with no wheezing and good diaphragmatic motion Heart:  S1/S2 SEM murmur, no rub, gallop or click PMI normal Abdomen: benighn, BS positve, small umbilical hernia Distal pulses intact with no bruits No edema Neuro non-focal Skin warm and dry No muscular weakness        ASSESSMENT & PLAN:   No problem-specific Assessment & Plan notes found for this encounter.      CAD:  moderate by cath  2019 with non ischemic myovue 2022 Continue plavix  ( GERD worse with ASA ) and statin and coreg  Chest pain with radiation to shoulder and fatigue/dyspnea will order PET /CT to r/o progression of CAD RBBB:  chronic yearly ECG HTN:  stable continue current meds including Losartan  HCTZ 4.   HLD on statin needs updated labs 5.   Murmur:  no change AV  sclerosis by TTE  05/02/21  6.   DM:  needs to be referred to endocrine If he cannot get Trulicity will need more insulin  Starting Ozempic 7.  GERD: followed by GI Dr Dorothy Gates EGD 06/23/21 with Schatzki ring and gastric polyps with colon showing diverticulosis Continue PPI Has hiatal hernia as well EGD 10/21/23 with gastric polyps and diffuse gastric inflammation biopsy/H pylori pending     PET/CT  Dispo:  F/U in a year    Medication Adjustments/Labs and Tests Ordered: Current medicines are reviewed at length with the patient today.  Concerns regarding medicines are outlined above.  Tests Ordered: Orders Placed This Encounter  Procedures  . EKG 12-Lead   Medication Changes: No orders of the defined types were placed in this encounter.  Signed, Janelle Mediate, MD  11/13/2023 8:26 AM    [facility] [address] [place], [place], [place]  [address] Phone: [phone]; Fax: [phone]

## 2023-11-13 ENCOUNTER — Ambulatory Visit: Payer: BC Managed Care – PPO | Attending: Cardiovascular Disease | Admitting: Cardiovascular Disease

## 2023-11-13 VITALS — BP 126/74 | HR 76 | Ht 70.0 in | Wt 209.6 lb

## 2023-11-13 DIAGNOSIS — E7800 Pure hypercholesterolemia, unspecified: Secondary | ICD-10-CM

## 2023-11-13 DIAGNOSIS — I10 Essential (primary) hypertension: Secondary | ICD-10-CM

## 2023-11-13 DIAGNOSIS — I2510 Atherosclerotic heart disease of native coronary artery without angina pectoris: Secondary | ICD-10-CM

## 2023-11-13 DIAGNOSIS — R072 Precordial pain: Secondary | ICD-10-CM

## 2023-11-13 NOTE — Patient Instructions (Addendum)
 Medication Instructions:  Your physician recommends that you continue on your current medications as directed. Please refer to the Current Medication list given to you today.  *If you need a refill on your cardiac medications before your next appointment, please call your pharmacy*  Lab Work: If you have labs (blood work) drawn today and your tests are completely normal, you will receive your results only by: MyChart Message (if you have MyChart) OR A paper copy in the mail If you have any lab test that is abnormal or we need to change your treatment, we will call you to review the results.  Testing/Procedures: Cardiac PET CT scanning, (CAT scanning), is a noninvasive, special x-ray that produces cross-sectional images of the body using x-rays and a computer. CT scans help physicians diagnose and treat medical conditions. For some CT exams, a contrast material is used to enhance visibility in the area of the body being studied. CT scans provide greater clarity and reveal more details than regular x-ray exams.  Follow-Up: At [facility], you and your health needs are our priority.  As part of our continuing mission to provide you with exceptional heart care, our providers are all part of one team.  This team includes your primary Cardiologist (physician) and Advanced Practice Providers or APPs (Physician Assistants and Nurse Practitioners) who all work together to provide you with the care you need, when you need it.  Your next appointment:   1 year(s)  Provider:   Janelle Mediate, MD     We recommend signing up for the patient portal called "MyChart".  Sign up information is provided on this After Visit Summary.  MyChart is used to connect with patients for Virtual Visits (Telemedicine).  Patients are able to view lab/test results, encounter notes, upcoming appointments, etc.  Non-urgent messages can be sent to your provider as well.   To learn more about what you can do with MyChart,  go to [email or web address].   Other Instructions     Please report to Radiology at the [facility] Main Entrance 30 minutes early for your test.  [address] [place], [place] [address]                         OR   Please report to Radiology at [facility] Main Entrance, medical mall, 30 mins prior to your test.  [address]  [place], [place]  How to Prepare for Your Cardiac PET/CT Stress Test:  Nothing to eat or drink, except water, 3 hours prior to arrival time.  NO caffeine/decaffeinated products, or chocolate 12 hours prior to arrival. (Please note decaffeinated beverages (teas/coffees) still contain caffeine).  If you have caffeine within 12 hours prior, the test will need to be rescheduled.  Medication instructions: Do not take erectile dysfunction medications for 72 hours prior to test (sildenafil, tadalafil) Do not take nitrates (isosorbide  mononitrate, Ranexa) the day before or day of test Do not take tamsulosin the day before or morning of test Hold theophylline containing medications for 12 hours. Hold Dipyridamole 48 hours prior to the test.  Diabetic Preparation: If able to eat breakfast prior to 3 hour fasting, you may take all medications, including your insulin . Do not worry if you miss your breakfast dose of insulin  - start at your next meal. If you do not eat prior to 3 hour fast-Hold all diabetes (oral and insulin ) medications. Patients who wear a continuous glucose monitor  MUST remove the device prior to scanning.  You may take your remaining medications with water.  NO cologne or lotion on chest or abdomen area.   Total time is 1 to 2 hours; you may want to bring reading material for the waiting time.  IF YOU THINK YOU MAY BE PREGNANT, OR ARE NURSING PLEASE INFORM THE TECHNOLOGIST.  In preparation for your appointment, medication and supplies will be purchased.  Appointment availability is limited, so if you  need to cancel or reschedule, please call the Radiology Department Scheduler at [phone] 24 hours in advance to avoid a cancellation fee of $100.00  What to Expect When you Arrive:  Once you arrive and check in for your appointment, you will be taken to a preparation room within the Radiology Department.  A technologist or Nurse will obtain your medical history, verify that you are correctly prepped for the exam, and explain the procedure.  Afterwards, an IV will be started in your arm and electrodes will be placed on your skin for EKG monitoring during the stress portion of the exam. Then you will be escorted to the PET/CT scanner.  There, staff will get you positioned on the scanner and obtain a blood pressure and EKG.  During the exam, you will continue to be connected to the EKG and blood pressure machines.  A small, safe amount of a radioactive tracer will be injected in your IV to obtain a series of pictures of your heart along with an injection of a stress agent.    After your Exam:  It is recommended that you eat a meal and drink a caffeinated beverage to counter act any effects of the stress agent.  Drink plenty of fluids for the remainder of the day and urinate frequently for the first couple of hours after the exam.  Your doctor will inform you of your test results within 7-10 business days.  For more information and frequently asked questions, please visit our website: [email or web address]  For questions about your test or how to prepare for your test, please call: Cardiac Imaging Nurse Navigators Office: [phone]       1st Floor: - Lobby - Registration  - Pharmacy  - Lab - Cafe  2nd Floor: - PV Lab - Diagnostic Testing (echo, CT, nuclear med)  3rd Floor: - Vacant  4th Floor: - TCTS (cardiothoracic surgery) - AFib Clinic - Structural Heart Clinic - Vascular Surgery  - Vascular Ultrasound  5th Floor: - HeartCare Cardiology (general and EP) -  Clinical Pharmacy for coumadin, hypertension, lipid, weight-loss medications, and med management appointments    Valet parking services will be available as well.

## 2023-12-05 ENCOUNTER — Ambulatory Visit: Payer: Self-pay | Admitting: Gastroenterology

## 2024-02-07 ENCOUNTER — Encounter (HOSPITAL_COMMUNITY): Payer: Self-pay

## 2024-02-11 ENCOUNTER — Ambulatory Visit (HOSPITAL_COMMUNITY)
Admission: RE | Admit: 2024-02-11 | Discharge: 2024-02-11 | Disposition: A | Discharge status: Home / Self Care | Source: Ambulatory Visit | Attending: Cardiovascular Disease | Admitting: Cardiovascular Disease

## 2024-02-11 ENCOUNTER — Ambulatory Visit: Payer: Self-pay | Admitting: Cardiovascular Disease

## 2024-02-11 DIAGNOSIS — R072 Precordial pain: Secondary | ICD-10-CM | POA: Diagnosis not present

## 2024-02-11 LAB — NM PET CT CARDIAC PERFUSION MULTI W/ABSOLUTE BLOODFLOW
MBFR: 3.33
Nuc Rest EF: 58 %
Nuc Stress EF: 64 %
Rest MBF: 0.78 ml/g/min
Rest Nuclear Isotope Dose: 24.5 mCi
ST Depression (mm): 0 mm
Stress MBF: 2.6 ml/g/min
Stress Nuclear Isotope Dose: 24.4 mCi
TID: 1.07

## 2024-02-11 MED ORDER — RUBIDIUM RB82 GENERATOR (RUBYFILL)
24.4000 | PACK | Freq: Once | INTRAVENOUS | Status: AC
Start: 2024-02-11 — End: 2024-02-11
  Administered 2024-02-11: 24.4 via INTRAVENOUS

## 2024-02-11 MED ORDER — CAFFEINE CITRATE BASE COMPONENT 10 MG/ML IV SOLN
INTRAVENOUS | Status: AC
  Filled 2024-02-11: qty 3

## 2024-02-11 MED ORDER — REGADENOSON 0.4 MG/5ML IV SOLN
0.4000 mg | Freq: Once | INTRAVENOUS | Status: AC
  Administered 2024-02-11: 0.4 mg via INTRAVENOUS

## 2024-02-11 MED ORDER — REGADENOSON 0.4 MG/5ML IV SOLN
INTRAVENOUS | Status: AC
  Filled 2024-02-11: qty 5

## 2024-02-11 MED ORDER — RUBIDIUM RB82 GENERATOR (RUBYFILL)
24.4600 | PACK | Freq: Once | INTRAVENOUS | Status: AC
Start: 2024-02-11 — End: 2024-02-11
  Administered 2024-02-11: 24.46 via INTRAVENOUS

## 2024-02-11 NOTE — Progress Notes (Signed)
 Pt. Tolerated lexi scan well.

## 2024-02-12 ENCOUNTER — Telehealth: Payer: Self-pay | Admitting: Cardiovascular Disease

## 2024-02-12 MED ORDER — NITROGLYCERIN 0.4 MG SL SUBL: SUBLINGUAL_TABLET | SUBLINGUAL | 9 refills | Status: AC

## 2024-02-12 NOTE — Telephone Encounter (Signed)
 Pt's medication was sent to pt's pharmacy as requested. Confirmation received.

## 2024-02-12 NOTE — Telephone Encounter (Signed)
*  STAT* If patient is at the pharmacy, call can be transferred to refill team.   1. Which medications need to be refilled? (please list name of each medication and dose if known) nitroGLYCERIN  (NITROSTAT ) 0.4 MG SL tablet    2. Would you like to learn more about the convenience, safety, & potential cost savings by using the [facility]?      3. Are you open to using the [facility] (Type [facility].  ).   4. Which pharmacy/location (including street and city if local pharmacy) is medication to be sent to? [id number] - [place], [place] - [address]    5. Do they need a 30 day or 90 day supply? 90 day

## 2024-04-08 ENCOUNTER — Other Ambulatory Visit: Payer: Self-pay | Admitting: Gastroenterology
